# Patient Record
Sex: Male | Born: 1970 | Race: White | Hispanic: No | Marital: Married | Smoking: Former smoker
Health system: Southern US, Community
[De-identification: ages and names within clinical notes are randomized; demographics above are authoritative.]

## PROBLEM LIST (undated history)

## (undated) DIAGNOSIS — G43909 Migraine, unspecified, not intractable, without status migrainosus: Secondary | ICD-10-CM

## (undated) DIAGNOSIS — M545 Low back pain, unspecified: Secondary | ICD-10-CM

## (undated) DIAGNOSIS — F419 Anxiety disorder, unspecified: Secondary | ICD-10-CM

## (undated) DIAGNOSIS — G43109 Migraine with aura, not intractable, without status migrainosus: Secondary | ICD-10-CM

## (undated) DIAGNOSIS — M1990 Unspecified osteoarthritis, unspecified site: Secondary | ICD-10-CM

## (undated) DIAGNOSIS — N200 Calculus of kidney: Secondary | ICD-10-CM

## (undated) DIAGNOSIS — T7840XA Allergy, unspecified, initial encounter: Secondary | ICD-10-CM

## (undated) DIAGNOSIS — E739 Lactose intolerance, unspecified: Secondary | ICD-10-CM

## (undated) DIAGNOSIS — I2510 Atherosclerotic heart disease of native coronary artery without angina pectoris: Secondary | ICD-10-CM

## (undated) DIAGNOSIS — E785 Hyperlipidemia, unspecified: Secondary | ICD-10-CM

## (undated) HISTORY — DX: Migraine with aura, not intractable, without status migrainosus: G43.109

## (undated) HISTORY — DX: Allergy, unspecified, initial encounter: T78.40XA

## (undated) HISTORY — PX: WISDOM TOOTH EXTRACTION: SHX21

## (undated) HISTORY — DX: Calculus of kidney: N20.0

## (undated) HISTORY — DX: Unspecified osteoarthritis, unspecified site: M19.90

## (undated) HISTORY — PX: SHOULDER SURGERY: SHX246

## (undated) HISTORY — DX: Low back pain, unspecified: M54.50

## (undated) HISTORY — PX: OTHER SURGICAL HISTORY: SHX169

## (undated) HISTORY — DX: Atherosclerotic heart disease of native coronary artery without angina pectoris: I25.10

## (undated) HISTORY — DX: Hyperlipidemia, unspecified: E78.5

## (undated) HISTORY — DX: Gilbert syndrome: E80.4

## (undated) HISTORY — DX: Migraine, unspecified, not intractable, without status migrainosus: G43.909

## (undated) HISTORY — DX: Lactose intolerance, unspecified: E73.9

## (undated) HISTORY — DX: Anxiety disorder, unspecified: F41.9

---

## 2002-08-14 ENCOUNTER — Ambulatory Visit: Admission: RE | Admit: 2002-08-14 | Discharge: 2002-08-14 | Payer: Self-pay | Admitting: Gastroenterology

## 2003-01-10 ENCOUNTER — Emergency Department (HOSPITAL_COMMUNITY): Admission: EM | Admit: 2003-01-10 | Discharge: 2003-01-10 | Payer: Self-pay | Admitting: Emergency Medicine

## 2003-01-10 ENCOUNTER — Encounter: Payer: Self-pay | Admitting: Emergency Medicine

## 2003-09-22 HISTORY — PX: FLEXIBLE SIGMOIDOSCOPY: SHX1649

## 2005-05-27 ENCOUNTER — Ambulatory Visit: Payer: Self-pay | Admitting: Internal Medicine

## 2007-12-21 ENCOUNTER — Ambulatory Visit: Payer: Self-pay | Admitting: Internal Medicine

## 2007-12-21 DIAGNOSIS — K602 Anal fissure, unspecified: Secondary | ICD-10-CM | POA: Insufficient documentation

## 2007-12-21 DIAGNOSIS — E739 Lactose intolerance, unspecified: Secondary | ICD-10-CM | POA: Insufficient documentation

## 2007-12-21 DIAGNOSIS — Z87442 Personal history of urinary calculi: Secondary | ICD-10-CM | POA: Insufficient documentation

## 2007-12-21 DIAGNOSIS — Z9189 Other specified personal risk factors, not elsewhere classified: Secondary | ICD-10-CM | POA: Insufficient documentation

## 2007-12-21 DIAGNOSIS — K625 Hemorrhage of anus and rectum: Secondary | ICD-10-CM | POA: Insufficient documentation

## 2007-12-21 LAB — CONVERTED CEMR LAB
Basophils Absolute: 0 10*3/uL (ref 0.0–0.1)
Basophils Relative: 0.4 % (ref 0.0–1.0)
Eosinophils Absolute: 0 10*3/uL (ref 0.0–0.7)
Eosinophils Relative: 0.6 % (ref 0.0–5.0)
HCT: 45.3 % (ref 39.0–52.0)
MCHC: 34.2 g/dL (ref 30.0–36.0)
MCV: 92 fL (ref 78.0–100.0)
Monocytes Absolute: 0.5 10*3/uL (ref 0.1–1.0)
Neutrophils Relative %: 56.9 % (ref 43.0–77.0)
Platelets: 186 10*3/uL (ref 150–400)

## 2007-12-22 ENCOUNTER — Encounter (INDEPENDENT_AMBULATORY_CARE_PROVIDER_SITE_OTHER): Payer: Self-pay | Admitting: *Deleted

## 2007-12-26 ENCOUNTER — Encounter (INDEPENDENT_AMBULATORY_CARE_PROVIDER_SITE_OTHER): Payer: Self-pay | Admitting: *Deleted

## 2008-01-10 ENCOUNTER — Ambulatory Visit: Payer: Self-pay | Admitting: Gastroenterology

## 2008-01-17 ENCOUNTER — Ambulatory Visit: Payer: Self-pay | Admitting: Internal Medicine

## 2008-01-17 DIAGNOSIS — L259 Unspecified contact dermatitis, unspecified cause: Secondary | ICD-10-CM | POA: Insufficient documentation

## 2008-01-18 ENCOUNTER — Encounter: Payer: Self-pay | Admitting: Internal Medicine

## 2008-01-24 ENCOUNTER — Telehealth: Payer: Self-pay | Admitting: Internal Medicine

## 2008-01-27 ENCOUNTER — Ambulatory Visit: Payer: Self-pay | Admitting: Internal Medicine

## 2008-01-27 ENCOUNTER — Telehealth (INDEPENDENT_AMBULATORY_CARE_PROVIDER_SITE_OTHER): Payer: Self-pay | Admitting: *Deleted

## 2008-02-14 ENCOUNTER — Ambulatory Visit: Payer: Self-pay | Admitting: Internal Medicine

## 2008-02-14 DIAGNOSIS — I776 Arteritis, unspecified: Secondary | ICD-10-CM | POA: Insufficient documentation

## 2008-03-07 ENCOUNTER — Telehealth (INDEPENDENT_AMBULATORY_CARE_PROVIDER_SITE_OTHER): Payer: Self-pay | Admitting: *Deleted

## 2008-07-04 ENCOUNTER — Ambulatory Visit: Payer: Self-pay | Admitting: Internal Medicine

## 2008-07-31 ENCOUNTER — Ambulatory Visit: Payer: Self-pay | Admitting: Internal Medicine

## 2008-07-31 DIAGNOSIS — M940 Chondrocostal junction syndrome [Tietze]: Secondary | ICD-10-CM | POA: Insufficient documentation

## 2008-11-22 ENCOUNTER — Ambulatory Visit: Payer: Self-pay | Admitting: Internal Medicine

## 2008-12-06 ENCOUNTER — Encounter (INDEPENDENT_AMBULATORY_CARE_PROVIDER_SITE_OTHER): Payer: Self-pay | Admitting: *Deleted

## 2009-05-21 ENCOUNTER — Ambulatory Visit: Payer: Self-pay | Admitting: Internal Medicine

## 2009-05-28 ENCOUNTER — Encounter (INDEPENDENT_AMBULATORY_CARE_PROVIDER_SITE_OTHER): Payer: Self-pay | Admitting: *Deleted

## 2009-08-08 ENCOUNTER — Ambulatory Visit: Payer: Self-pay | Admitting: Internal Medicine

## 2009-08-08 DIAGNOSIS — E785 Hyperlipidemia, unspecified: Secondary | ICD-10-CM | POA: Insufficient documentation

## 2009-08-08 LAB — CONVERTED CEMR LAB: Cholesterol, target level: 200 mg/dL

## 2010-07-21 ENCOUNTER — Ambulatory Visit: Payer: Self-pay | Admitting: Family Medicine

## 2010-07-21 DIAGNOSIS — J019 Acute sinusitis, unspecified: Secondary | ICD-10-CM | POA: Insufficient documentation

## 2010-07-21 DIAGNOSIS — L049 Acute lymphadenitis, unspecified: Secondary | ICD-10-CM | POA: Insufficient documentation

## 2010-10-19 LAB — CONVERTED CEMR LAB
ALT: 13 units/L (ref 0–53)
AST: 16 units/L (ref 0–37)
Albumin: 4.5 g/dL (ref 3.5–5.2)
Alkaline Phosphatase: 41 units/L (ref 39–117)
BUN: 18 mg/dL (ref 6–23)
Basophils Absolute: 0 10*3/uL (ref 0.0–0.1)
Basophils Relative: 0 % (ref 0.0–3.0)
Bilirubin, Direct: 0.1 mg/dL (ref 0.0–0.3)
CO2: 30 meq/L (ref 19–32)
Calcium: 9.6 mg/dL (ref 8.4–10.5)
Chloride: 104 meq/L (ref 96–112)
Cholesterol: 183 mg/dL (ref 0–200)
Creatinine, Ser: 0.9 mg/dL (ref 0.4–1.5)
Eosinophils Absolute: 0 10*3/uL (ref 0.0–0.7)
Eosinophils Relative: 0.5 % (ref 0.0–5.0)
GFR calc Af Amer: 121 mL/min
GFR calc non Af Amer: 100 mL/min
Glucose, Bld: 109 mg/dL — ABNORMAL HIGH (ref 70–99)
HCT: 46.5 % (ref 39.0–52.0)
HDL: 46.6 mg/dL (ref >39.0)
Hemoglobin: 16.3 g/dL (ref 13.0–17.0)
Hgb A1c MFr Bld: 5.2 % (ref 4.6–6.5)
LDL Cholesterol: 122 mg/dL — ABNORMAL HIGH (ref 0–99)
Lymphocytes Relative: 38 % (ref 12.0–46.0)
MCHC: 35 g/dL (ref 30.0–36.0)
MCV: 94.4 fL (ref 78.0–100.0)
Monocytes Absolute: 0.4 10*3/uL (ref 0.1–1.0)
Monocytes Relative: 7.3 % (ref 3.0–12.0)
Neutro Abs: 3.1 10*3/uL (ref 1.4–7.7)
Neutrophils Relative %: 54.2 % (ref 43.0–77.0)
PSA: 0.39 ng/mL (ref 0.10–4.00)
Platelets: 191 10*3/uL (ref 150–400)
Potassium: 4.3 meq/L (ref 3.5–5.1)
RBC: 4.93 M/uL (ref 4.22–5.81)
RDW: 12.4 % (ref 11.5–14.6)
Sodium: 141 meq/L (ref 135–145)
TSH: 2.66 microintl units/mL (ref 0.35–5.50)
Total Bilirubin: 1.3 mg/dL — ABNORMAL HIGH (ref 0.3–1.2)
Total CHOL/HDL Ratio: 3.9
Total Protein: 7.1 g/dL (ref 6.0–8.3)
Triglycerides: 72 mg/dL (ref 0–149)
VLDL: 14 mg/dL (ref 0–40)
WBC: 5.7 10*3/uL (ref 4.5–10.5)

## 2010-10-21 NOTE — Assessment & Plan Note (Signed)
Summary: SINUS INF?, NO FEVER///SPH   Vital Signs:  Patient profile:   40 year old male Height:      69.50 inches Weight:      176 pounds BMI:     25.71 Temp:     98.1 degrees F oral BP sitting:   120 / 78  (left arm)  Vitals Entered By: Doristine Devoid CMA (July 21, 2010 1:36 PM) CC: sinus infection x1 wk along w/ HA and drainage    History of Present Illness: 40 yo man here today for ? sinus infxn.  sxs started as URI 5 days ago.  progressively worsening.  multiple sick contacts at home.  small painful LN behind L ear.  no fever.  + facial pain behind eyes.  + nasal congestion- 'greenish brown'.  cough intermittantly productive of similar sputum.  no ear pain.  + laryngitis.  has baby due by the end of this week.  Current Medications (verified): 1)  Fluoxetine Hcl 10 Mg Tabs (Fluoxetine Hcl) .... Take One Tablet Daily  Allergies (verified): No Known Drug Allergies  Review of Systems      See HPI  Physical Exam  General:  well-nourished,in no acute distress; alert,appropriate and cooperative throughout examination Head:  NCAT, mild TTP over maxillary sinuses Eyes:  no injxn or inflammation Ears:  External ear exam shows no significant lesions or deformities.  Otoscopic examination reveals clear canals, tympanic membranes are intact bilaterally without bulging, retraction, inflammation or discharge. Hearing is grossly normal bilaterally. Nose:  mild congestion Mouth:  Oral mucosa and oropharynx without lesions or exudates.  Teeth in good repair. Neck:  post LAD, mildly TTP Lungs:  Normal respiratory effort, chest expands symmetrically. Lungs are clear to auscultation, no crackles or wheezes. Heart:  Normal rate and regular rhythm. S1 and S2 normal without gallop, murmur, click, rub. S4   Impression & Recommendations:  Problem # 1:  SINUSITIS - ACUTE-NOS (ICD-461.9) Assessment New sxs may be viral but given progression of sxs and impending arrival of new baby will start  abx.  reviewed supportive care and red flags that should prompt return.  Pt expresses understanding and is in agreement w/ this plan. His updated medication list for this problem includes:    Doxycycline Hyclate 100 Mg Caps (Doxycycline hyclate) .Marland Kitchen... Take 1 tab twice a day.  take w/ food.  Problem # 2:  ACUTE LYMPHADENITIS (ICD-683) Assessment: New will start doxy His updated medication list for this problem includes:    Doxycycline Hyclate 100 Mg Caps (Doxycycline hyclate) .Marland Kitchen... Take 1 tab twice a day.  take w/ food.  Complete Medication List: 1)  Fluoxetine Hcl 10 Mg Tabs (Fluoxetine hcl) .... Take one tablet daily 2)  Doxycycline Hyclate 100 Mg Caps (Doxycycline hyclate) .... Take 1 tab twice a day.  take w/ food.  Patient Instructions: 1)  Take the Doxycycline as directed for the sinuses and the lymp node- take w/ food to avoid upset stomach 2)  Drink LOTS of fluids 3)  Ibuprofen for pain/fever- this will help w/ laryngitis also 4)  REST!!! 5)  Good luck with the new baby!!! 6)  Hang in there!! Prescriptions: DOXYCYCLINE HYCLATE 100 MG CAPS (DOXYCYCLINE HYCLATE) Take 1 tab twice a day.  take w/ food.  #20 x 0   Entered and Authorized by:   Neena Rhymes MD   Signed by:   Neena Rhymes MD on 07/21/2010   Method used:   Electronically to        [facility]  [address] [phone]* (retail)       [address]       [place], [place]  [address]       Ph: [phone] or [phone]       Fax: [phone]   RxID:   [id number]    Orders Added: 1)  Est. Patient Level III [phone]

## 2011-02-03 NOTE — Assessment & Plan Note (Signed)
[facility]                         GASTROENTEROLOGY OFFICE NOTE   NAME:[name], [name] GOZA                    MRN:          [id number]  DATE:01/10/2008                            DOB:          [birth year]    REASON FOR CONSULTATION:  Rectal pain.   HISTORY OF PRESENT ILLNESS:  [name] [name] is a pleasant 40 year old white  male referred through the courtesy of Dr. Alwyn [name] for evaluation.  For  the last five months, he has been suffering from severe rectal pain,  consisting of pain with defecation.  With a bowel movement, he has a  tearing, searing pain in his rectum followed by small amounts of blood  on the toilet tissue or coating the stools.  He moves his bowels  regularly.  He denies abdominal pain.  He underwent a sigmoidoscopy in  2003 for rectal bleeding and was found to have internal hemorrhoids.  He  has been treated with various suppositories and topical without much  improvement.   PAST MEDICAL HISTORY:  Pertinent for kidney stones.  He has had shoulder  surgery .   FAMILY HISTORY:  Pertinent for father and grandfather with prostate  cancer.   MEDICATIONS:  None.   ALLERGIES:  None.   SOCIAL HISTORY:  He does not smoke.  He has 2-4 beers a week.  He is  married and is self-employed.   REVIEW OF SYSTEMS:  Reviewed and is negative.   PHYSICAL EXAMINATION:  GENERAL:  A healthy appearing male.  VITAL SIGNS:  Pulse 80, blood pressure 124/84, weight 170.  HEENT:  EOMI.  PERRLA.  Sclerae are anicteric.  Conjunctivae are pink.  NECK:  Supple without thyromegaly, adenopathy or carotid bruits.  CHEST:  Clear to auscultation and percussion without adventitious  sounds.  CARDIAC:  Regular rhythm; normal S1 S2.  There are no murmurs, gallops  or rubs.  ABDOMEN:  Bowel sounds are normoactive.  Abdomen is soft, nontender and  nondistended.  There are no abdominal masses, tenderness, splenic  enlargement or hepatomegaly.  EXTREMITIES:  Full range  of motion.  No cyanosis, clubbing or edema.  RECTAL:  There are no obvious external lesions.  MUSCULOSKELETAL:  No deformities.  NEUROLOGICAL:  He is alert and oriented x3 and is nonfocal.   IMPRESSION:  Symptomatic anal fissure.  I am certain that if I examined  him with an anoscope I would find a fissure in the anterior midline.   RECOMMENDATION:  1. Diltiazem ointment 2% b.i.d.  2. Stool softeners.  3. Warm soaks.  4. If these measures are ineffective, I would proceed with Botox      injection of his anal fissure.  Family met, he would require a      sphincterotomy.     Barbette Hair. Arlyce Dice, MD,FACG  Electronically Signed    RDK/MedQ  DD: 01/10/2008  DT: 01/10/2008  Job #: [id number]   cc:   Titus Dubin. [name] Ren, MD,FACP,FCCP

## 2011-05-20 ENCOUNTER — Encounter: Payer: Self-pay | Admitting: Internal Medicine

## 2011-06-29 ENCOUNTER — Encounter: Payer: Self-pay | Admitting: Internal Medicine

## 2011-06-29 ENCOUNTER — Ambulatory Visit (INDEPENDENT_AMBULATORY_CARE_PROVIDER_SITE_OTHER): Payer: BC Managed Care – PPO | Admitting: Internal Medicine

## 2011-06-29 VITALS — BP 120/84 | HR 75 | Temp 98.4°F | Resp 12 | Ht 68.0 in | Wt 171.6 lb

## 2011-06-29 DIAGNOSIS — F419 Anxiety disorder, unspecified: Secondary | ICD-10-CM

## 2011-06-29 DIAGNOSIS — E785 Hyperlipidemia, unspecified: Secondary | ICD-10-CM

## 2011-06-29 DIAGNOSIS — Z0000 Encounter for general adult medical examination without abnormal findings: Secondary | ICD-10-CM

## 2011-06-29 DIAGNOSIS — F411 Generalized anxiety disorder: Secondary | ICD-10-CM

## 2011-06-29 DIAGNOSIS — Z23 Encounter for immunization: Secondary | ICD-10-CM

## 2011-06-29 DIAGNOSIS — E739 Lactose intolerance, unspecified: Secondary | ICD-10-CM

## 2011-06-29 MED ORDER — LORAZEPAM 0.5 MG PO TABS: 0.5000 mg | ORAL_TABLET | ORAL | Status: AC

## 2011-06-29 NOTE — Progress Notes (Signed)
Subjective:    Patient ID: [name] [name], male    DOB: Dec 19, 1970, 40 y.o.   MRN: [id number]  HPI  [name] [name]  is here for a physical;acute issues include resolving RTI .       Review of Systems Patient reports no significant   vision/ hearing changes,anorexia, weight change, fever ,adenopathy, persistant / recurrent hoarseness, swallowing issues, chest pain,palpitations, edema,persistant / recurrent cough, hemoptysis, dyspnea(rest, exertional, paroxysmal nocturnal), gastrointestinal  bleeding (melena, , abdominal pain, excessive heart burn, GU symptoms( dysuria, hematuria, pyuria, voiding/incontinence  issues) syncope, focal weakness, memory loss,numbness & tingling, skin/hair/nail changes,depression, anxiety,or  abnormal bruising/bleeding. Musculoskeletal symptoms include intermittent  R knee pain post squatting or post exercise. He has experienced intermittent rectal bleeding attributed to hemorrhoids found at flexible sigmoidoscopy. There is no family history of colon cancer     Objective:   Physical Exam Gen.: Healthy and well-nourished in appearance. Alert, appropriate and cooperative throughout exam. Head: Normocephalic without obvious abnormalities  Eyes: No corneal or conjunctival inflammation noted. Pupils equal round reactive to light and accommodation. Fundal exam is benign without hemorrhages, exudate, papilledema. Extraocular motion intact. Vision grossly normal. Ears: External  ear exam reveals no significant lesions or deformities. Canals clear .TMs normal. Hearing is grossly normal bilaterally. Nose: External nasal exam reveals no deformity or inflammation. Nasal mucosa are pink and moist. No lesions or exudates noted.   Mouth: Oral mucosa and oropharynx reveal no lesions or exudates. Teeth in good repair. Neck: No deformities, masses, or tenderness noted. Range of motion  & Thyroid normal. Lungs: Normal respiratory effort; chest expands symmetrically. Lungs are clear to  auscultation without rales, wheezes, or increased work of breathing. Heart: Normal rate and rhythm. Normal S1 and S2. No gallop,  or rub. Loud S4 vs click @ apex; no  murmur. Abdomen: Bowel sounds normal; abdomen soft and nontender. No masses, organomegaly or hernias noted. Genitalia/DRE: Varicoele on the left; prostate is normal without enlargement, nodularity, or induration.   .                                                                                   Musculoskeletal/extremities: No deformity or scoliosis noted of  the thoracic or lumbar spine. No clubbing, cyanosis, edema, or deformity noted. Range of motion  normal .Tone & strength  normal.Joints normal. Nail health  good. Vascular: Carotid, radial artery, dorsalis pedis and  posterior tibial pulses are full and equal. No bruits present. Neurologic: Alert and oriented x3. Deep tendon reflexes symmetrical and normal.          Skin: Intact without suspicious lesions or rashes. Lymph: No cervical, axillary, or inguinal lymphadenopathy present. Psych: Mood and affect are normal. Normally interactive  Assessment & Plan:  #1 comprehensive physical exam; no acute findings #2 see Problem List with Assessments & Recommendations Plan: see Orders

## 2011-06-29 NOTE — Patient Instructions (Signed)
Preventive Health Care: Exercise at least 30-45 minutes a day,  3-4 days a week.  Eat a low-fat diet with lots of fruits and vegetables, up to 7-9 servings per day. Consume less than 40 grams of sugar per day from foods & drinks with High Fructose Corn Sugar as # 1,2,3 or # 4 on label. Alcohol If you drink, do it moderately,less than 9 drinks per week, preferably less than 6 @ most. Health Care Power of Attorney & Living Will. Complete if not in place ; these place you in charge of your health care decisions.  

## 2011-06-30 LAB — CBC WITH DIFFERENTIAL/PLATELET
Basophils Relative: 0.1 % (ref 0.0–3.0)
Eosinophils Absolute: 0.1 10*3/uL (ref 0.0–0.7)
Eosinophils Relative: 1.1 % (ref 0.0–5.0)
Lymphocytes Relative: 37.4 % (ref 12.0–46.0)
MCHC: 34 g/dL (ref 30.0–36.0)
Monocytes Absolute: 0.4 10*3/uL (ref 0.1–1.0)
Neutrophils Relative %: 55 % (ref 43.0–77.0)
Platelets: 197 10*3/uL (ref 150.0–400.0)
RBC: 5.01 Mil/uL (ref 4.22–5.81)
WBC: 7 10*3/uL (ref 4.5–10.5)

## 2011-06-30 LAB — HEPATIC FUNCTION PANEL
ALT: 17 U/L (ref 0–53)
AST: 20 U/L (ref 0–37)
Albumin: 4.8 g/dL (ref 3.5–5.2)
Alkaline Phosphatase: 60 U/L (ref 39–117)
Total Protein: 7.7 g/dL (ref 6.0–8.3)

## 2011-06-30 LAB — LIPID PANEL
Cholesterol: 230 mg/dL — ABNORMAL HIGH (ref 0–200)
HDL: 48.8 mg/dL (ref >39.00)
Triglycerides: 175 mg/dL — ABNORMAL HIGH (ref 0.0–149.0)

## 2011-06-30 LAB — LDL CHOLESTEROL, DIRECT: Direct LDL: 148.6 mg/dL

## 2011-06-30 LAB — BASIC METABOLIC PANEL
BUN: 14 mg/dL (ref 6–23)
Calcium: 9.5 mg/dL (ref 8.4–10.5)
Creatinine, Ser: 1 mg/dL (ref 0.4–1.5)

## 2011-06-30 LAB — PSA: PSA: 0.51 ng/mL (ref 0.10–4.00)

## 2011-06-30 LAB — TSH: TSH: 2.67 u[IU]/mL (ref 0.35–5.50)

## 2011-07-08 ENCOUNTER — Other Ambulatory Visit: Payer: Self-pay | Admitting: Internal Medicine

## 2011-07-08 NOTE — Telephone Encounter (Signed)
Patient received copy of lab dr hopper said to consider pravastatin & repeat lab in 10 weeks - patient would like rx called [facility]

## 2011-07-09 ENCOUNTER — Telehealth: Payer: Self-pay | Admitting: Internal Medicine

## 2011-07-10 MED ORDER — PRAVASTATIN SODIUM 20 MG PO TABS: 20.0000 mg | ORAL_TABLET | Freq: Every day | ORAL | Status: DC

## 2011-07-10 NOTE — Telephone Encounter (Signed)
RX sent to pharmacy, patient aware. 

## 2011-10-31 ENCOUNTER — Other Ambulatory Visit: Payer: Self-pay | Admitting: Internal Medicine

## 2011-11-02 NOTE — Telephone Encounter (Signed)
(  lipids, hepatic panel, CK; 272.4, 995.20).   

## 2012-03-04 ENCOUNTER — Telehealth: Payer: Self-pay | Admitting: Internal Medicine

## 2012-03-04 DIAGNOSIS — E785 Hyperlipidemia, unspecified: Secondary | ICD-10-CM

## 2012-03-04 DIAGNOSIS — T887XXA Unspecified adverse effect of drug or medicament, initial encounter: Secondary | ICD-10-CM

## 2012-03-04 NOTE — Telephone Encounter (Signed)
Correct

## 2012-03-04 NOTE — Telephone Encounter (Signed)
Patient was due labs 10/wks from 06/2011 appt.., patient wants to come in 6.27 to have them done now. Based on last labs the following orders need to be out in   repeat fasting labs in 10 weeks (lipids, hepatic panel, CK; 272.4, 995.20).

## 2012-03-08 NOTE — Telephone Encounter (Signed)
Chrae- can you put the orders in for this patient please? Thank you!

## 2012-03-08 NOTE — Telephone Encounter (Signed)
Orders placed.

## 2012-03-17 ENCOUNTER — Other Ambulatory Visit (INDEPENDENT_AMBULATORY_CARE_PROVIDER_SITE_OTHER): Payer: BC Managed Care – PPO

## 2012-03-17 DIAGNOSIS — E785 Hyperlipidemia, unspecified: Secondary | ICD-10-CM

## 2012-03-17 DIAGNOSIS — T887XXA Unspecified adverse effect of drug or medicament, initial encounter: Secondary | ICD-10-CM

## 2012-03-17 LAB — HEPATIC FUNCTION PANEL
ALT: 17 U/L (ref 0–53)
Bilirubin, Direct: 0.2 mg/dL (ref 0.0–0.3)
Total Protein: 7.6 g/dL (ref 6.0–8.3)

## 2012-03-17 LAB — LIPID PANEL
Cholesterol: 174 mg/dL (ref 0–200)
VLDL: 36.2 mg/dL (ref 0.0–40.0)

## 2012-03-17 NOTE — Progress Notes (Signed)
Labs only

## 2012-04-06 NOTE — Telephone Encounter (Signed)
error 

## 2012-06-30 ENCOUNTER — Ambulatory Visit (INDEPENDENT_AMBULATORY_CARE_PROVIDER_SITE_OTHER): Payer: BC Managed Care – PPO | Admitting: Internal Medicine

## 2012-06-30 ENCOUNTER — Encounter: Payer: Self-pay | Admitting: Internal Medicine

## 2012-06-30 VITALS — BP 126/84 | HR 80 | Temp 98.4°F | Resp 12 | Ht 68.03 in | Wt 172.6 lb

## 2012-06-30 DIAGNOSIS — E785 Hyperlipidemia, unspecified: Secondary | ICD-10-CM

## 2012-06-30 DIAGNOSIS — Z0000 Encounter for general adult medical examination without abnormal findings: Secondary | ICD-10-CM

## 2012-06-30 DIAGNOSIS — Z23 Encounter for immunization: Secondary | ICD-10-CM

## 2012-06-30 LAB — BASIC METABOLIC PANEL
BUN: 16 mg/dL (ref 6–23)
CO2: 28 mEq/L (ref 19–32)
Calcium: 9.3 mg/dL (ref 8.4–10.5)
Glucose, Bld: 101 mg/dL — ABNORMAL HIGH (ref 70–99)
Potassium: 3.9 mEq/L (ref 3.5–5.1)
Sodium: 137 mEq/L (ref 135–145)

## 2012-06-30 LAB — LIPID PANEL
Cholesterol: 197 mg/dL (ref 0–200)
HDL: 45.6 mg/dL (ref >39.00)
Triglycerides: 149 mg/dL (ref 0.0–149.0)
VLDL: 29.8 mg/dL (ref 0.0–40.0)

## 2012-06-30 LAB — CBC WITH DIFFERENTIAL/PLATELET
Basophils Absolute: 0 10*3/uL (ref 0.0–0.1)
Eosinophils Absolute: 0.1 10*3/uL (ref 0.0–0.7)
HCT: 47.4 % (ref 39.0–52.0)
Hemoglobin: 15.9 g/dL (ref 13.0–17.0)
Lymphs Abs: 1.2 10*3/uL (ref 0.7–4.0)
MCHC: 33.6 g/dL (ref 30.0–36.0)
Neutro Abs: 3.6 10*3/uL (ref 1.4–7.7)
Platelets: 162 10*3/uL (ref 150.0–400.0)
RDW: 13.1 % (ref 11.5–14.6)

## 2012-06-30 LAB — HEPATIC FUNCTION PANEL: Albumin: 4.6 g/dL (ref 3.5–5.2)

## 2012-06-30 MED ORDER — FLUOXETINE HCL 10 MG PO CAPS: 10.0000 mg | ORAL_CAPSULE | Freq: Every day | ORAL | Status: DC

## 2012-06-30 NOTE — Progress Notes (Signed)
Subjective:    Patient ID: [name] [name], male    DOB: 12-28-1970, 41 y.o.   MRN: [id number]  HPI  [name] [name] is here for a physical;acute issues are mainly related to job stresses      Review of Systems Dyslipidemia assessment: Prior Advanced Lipid Testing: NMR Lipoprofile 2010; LDL goal less than 110   Family history of premature CAD/ MI: no; PGF MI @ 53 .  Nutrition: no specific plan .  Exercise: 3-5X/week as cardio& weight > 60 min . Diabetes : no . HTN: no. Smoking history  : quit 1994 .   Weight :  stable. ROS: fatigue: some due to disrupted sleep related to new job ; chest pain : no ;claudication: no; palpitations: no; abd pain/bowel changes: no ; myalgias:no;  syncope : no ; skin changes: no.  He discontinued and the statin in February due to 2 foot and ankle pain. Followup lipids in June revealed an LDL of 94 OFF statins. Aspirin intake has been somewhat erratic        Objective:   Physical Exam Gen.: Healthy and well-nourished in appearance. Alert, appropriate and cooperative throughout exam. Head: Normocephalic without obvious abnormalities  Eyes: No corneal or conjunctival inflammation noted. Pupils equal round reactive to light and accommodation. Fundal exam is benign without hemorrhages, exudate, papilledema. Extraocular motion intact. Vision grossly normal. Ears: External  ear exam reveals no significant lesions or deformities. Canals : wax on R; L TM normal. Hearing is grossly normal bilaterally. Nose: External nasal exam reveals no deformity or inflammation. Nasal mucosa are pink and moist. No lesions or exudates noted.   Mouth: Oral mucosa and oropharynx reveal no lesions or exudates. Teeth in good repair. Neck: No deformities, masses, or tenderness noted. Range of motion & Thyroid normal. Lungs: Normal respiratory effort; chest expands symmetrically. Lungs are clear to auscultation without rales, wheezes, or increased work of breathing. Heart: Normal rate and  rhythm. Normal S1 and S2. No gallop, click, or rub. S4 w/o murmur. Abdomen: Bowel sounds normal; abdomen soft and nontender. No masses, organomegaly or hernias noted. Genitalia/ DRE: Genitalia normal except for left varices. Prostate is normal without enlargement, asymmetry, nodularity, or induration.                                                              Musculoskeletal/extremities: No deformity or scoliosis noted of  the thoracic or lumbar spine. No clubbing, cyanosis, edema, or deformity noted. Range of motion  normal .Tone & strength  normal.Joints normal. Nail health  good. Vascular: Carotid, radial artery, dorsalis pedis and  posterior tibial pulses are full and equal. No bruits present. Neurologic: Alert and oriented x3. Deep tendon reflexes symmetrical and normal.         Skin: Intact without suspicious lesions or rashes. Lymph: No cervical, axillary, or inguinal lymphadenopathy present. Psych: Mood and affect are normal. Normally interactive  Assessment & Plan:  Annual podiatry and retinal assessments are indicated because the diabetes.

## 2012-06-30 NOTE — Patient Instructions (Addendum)
Preventive Health Care: Eat a low-fat diet with lots of fruits and vegetables, up to 7-9 servings per day.  Consume less than 40 grams of sugar per day from foods & drinks with High Fructose Corn Sugar as #2,3 or # 4 on label. Please take enteric-coated aspirin 81 mg daily with breakfast.  Health Care Power of Attorney & Living Will. Complete if not in place ; these place you in charge of your health care decisions. If you activate My Chart; the results can be released to you as soon as they populate from the lab. If you choose not to use this program; the labs have to be reviewed, copied & mailed   causing a delay in getting the results to you.

## 2012-12-29 ENCOUNTER — Encounter: Payer: Self-pay | Admitting: Internal Medicine

## 2012-12-29 ENCOUNTER — Ambulatory Visit (INDEPENDENT_AMBULATORY_CARE_PROVIDER_SITE_OTHER): Payer: 59 | Admitting: Internal Medicine

## 2012-12-29 VITALS — BP 134/80 | HR 71 | Temp 97.9°F | Wt 172.0 lb

## 2012-12-29 DIAGNOSIS — E785 Hyperlipidemia, unspecified: Secondary | ICD-10-CM

## 2012-12-29 DIAGNOSIS — G43109 Migraine with aura, not intractable, without status migrainosus: Secondary | ICD-10-CM | POA: Insufficient documentation

## 2012-12-29 DIAGNOSIS — G453 Amaurosis fugax: Secondary | ICD-10-CM

## 2012-12-29 DIAGNOSIS — H3400 Transient retinal artery occlusion, unspecified eye: Secondary | ICD-10-CM

## 2012-12-29 HISTORY — DX: Migraine with aura, not intractable, without status migrainosus: G43.109

## 2012-12-29 NOTE — Assessment & Plan Note (Signed)
Carotid Dopplers, echocardiogram, fasting lipids.  Baby aspirin will be increased to 325 mg aspirin pending full ophthalmologic evaluation

## 2012-12-29 NOTE — Progress Notes (Signed)
  Subjective:    Patient ID: [name] [name], male    DOB: [birth year], 42 y.o.   MRN: [id number]  HPI  Since early March  he's had 2 episodes of vision loss in the left eye lasting 12-15 minutes w/o diplopia. It is described as a "arc" in the shape of a reversed C.Th last episode was 12/28/12.Last Ophth exam 18 mos ago. No headache, numbness and tingling,limb  weakness, change in coordination (gait/falling), frank vertigo, or pre syncope.  There was no specific exacerbating factor such as  position change such as turning head ;   straining ,pain, cardiac or neurologic prodrome.  He has dyslipidemia; his pravastatin had been discontinued due to arthralgias.       Review of Systems Constitutional : No fever , chills, sweats , change in weight ENT:No sinus pressure;nasal purulence; hearing loss/tinnitus;earache ;otic discharge Cardiac prodrome: No palpitations, irregular rhythm, heart rate change, nausea , sweating Pulmonary: No acute dyspnea, cough Psych: No significant anxiety,panic , depression     Objective:   Physical Exam Gen. appearance: Well-nourished, in no distress Eyes: Extraocular motion intact, field of vision normal, vision grossly intact, no nystagmus. Fundi benign ENT: Wax on R; L TM normal, tuning fork exam normal, hearing grossly normal Neck: Normal range of motion, no masses, normal thyroid Cardiovascular: Rate and rhythm normal; no murmurs, or gallops . Click @ LLSB Muscle skeletal: Range of motion, tone, &  strength normal Neuro:Oriented X3.No cranial nerve deficit, deep tendon  reflexes normal, gait  normal. Rhomberg & finger to nose negative. Lymph: No cervical or axillary LA Skin: Warm and dry without suspicious lesions or rashes Psych: no anxiety or mood change. Normally interactive and cooperative.         Assessment & Plan:

## 2012-12-29 NOTE — Patient Instructions (Addendum)
Please  schedule fasting Labs : CK,Lipids, CRP, sed rate.PLEASE BRING THESE INSTRUCTIONS TO FOLLOW UP  LAB APPOINTMENT.This will guarantee correct labs are drawn, eliminating need for repeat blood sampling ( needle sticks ! ). Diagnoses /Codes: 362.34,272.4 Review and correct the record as indicated. Please share record with all medical staff seen.

## 2012-12-29 NOTE — Assessment & Plan Note (Signed)
He's been off statin due to ankle and foot arthralgias. He takes a supplement red Rice Yeast

## 2012-12-30 ENCOUNTER — Ambulatory Visit: Payer: BC Managed Care – PPO | Admitting: Internal Medicine

## 2012-12-30 NOTE — Addendum Note (Signed)
Addended by: Maurice Small on: 12/30/2012 09:12 AM   Modules accepted: Orders

## 2013-01-02 ENCOUNTER — Telehealth: Payer: Self-pay

## 2013-01-02 ENCOUNTER — Other Ambulatory Visit (INDEPENDENT_AMBULATORY_CARE_PROVIDER_SITE_OTHER): Payer: 59

## 2013-01-02 DIAGNOSIS — E785 Hyperlipidemia, unspecified: Secondary | ICD-10-CM

## 2013-01-02 LAB — C-REACTIVE PROTEIN: CRP: <0.5 mg/dL (ref 0.5–20.0)

## 2013-01-02 LAB — LIPID PANEL
Cholesterol: 163 mg/dL (ref 0–200)
LDL Cholesterol: 102 mg/dL — ABNORMAL HIGH (ref 0–99)
Total CHOL/HDL Ratio: 4
VLDL: 23 mg/dL (ref 0.0–40.0)

## 2013-01-02 LAB — SEDIMENTATION RATE: Sed Rate: 6 mm/hr (ref 0–22)

## 2013-01-02 NOTE — Telephone Encounter (Signed)
If Dr Reginal Lutes does not feel these are necessary; they can be deferred for present

## 2013-01-02 NOTE — Telephone Encounter (Signed)
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 Patient wold like to know if he should proceed with Echo and Carotid U/S based on this diagnosis.  Hopp please advise

## 2013-01-03 ENCOUNTER — Other Ambulatory Visit: Payer: Self-pay | Admitting: Internal Medicine

## 2013-01-03 ENCOUNTER — Other Ambulatory Visit (HOSPITAL_COMMUNITY): Payer: 59

## 2013-01-03 NOTE — Telephone Encounter (Signed)
It is my understanding that he had stopped the statins before  his most recent lipids which  did not show dramatic increase in risk. She does stay off statin but monitor the fasting lipids annually.

## 2013-01-03 NOTE — Telephone Encounter (Signed)
Left message to call office

## 2013-01-03 NOTE — Telephone Encounter (Signed)
Discuss with patient  

## 2013-01-03 NOTE — Telephone Encounter (Signed)
Pravastatin is not currently on medication list, did you want patient to restart pravastatin ?

## 2013-01-04 NOTE — Telephone Encounter (Signed)
Left message to call office

## 2013-01-06 ENCOUNTER — Telehealth: Payer: Self-pay | Admitting: Internal Medicine

## 2013-01-06 NOTE — Telephone Encounter (Signed)
Refill request for pravastatin 20mg . This med is not listed on pt's current med list but is listed in pt's allergies. Please advise.

## 2013-01-06 NOTE — Telephone Encounter (Signed)
lmovm for pt to return call.  

## 2013-01-06 NOTE — Telephone Encounter (Signed)
Please see most recent lipid results; these were excellent. If he were on the pravastatin, these were done; renew it. If he had not been on a statin when these were drawn he can stay off the medication.Please verify

## 2013-01-09 NOTE — Telephone Encounter (Signed)
Left message to call office

## 2013-01-10 ENCOUNTER — Telehealth: Payer: Self-pay

## 2013-01-10 NOTE — Telephone Encounter (Signed)
I have addressed this before; his most recent lipids were very good and at goal. If he were not on the pravastatin when these were drawn he can leave it off and monitor lipids annually. If he were on the pravastatin last lipids were done; it can be renewed for one year.

## 2013-01-10 NOTE — Telephone Encounter (Signed)
[facility] was [facility] to [facility] per patient request. Pravastatin is no longer on patients medication list, Hopp please advise  Patient was previously taking pravastatin 20 mg

## 2013-01-10 NOTE — Telephone Encounter (Signed)
Left message on voicemail for the pharmacy informing them rx will not be filled at this time, last lipid profile was within normal limits

## 2013-01-10 NOTE — Telephone Encounter (Signed)
Discuss with patient  

## 2013-03-29 HISTORY — PX: OTHER SURGICAL HISTORY: SHX169

## 2013-06-28 ENCOUNTER — Other Ambulatory Visit: Payer: Self-pay | Admitting: Internal Medicine

## 2013-06-28 NOTE — Telephone Encounter (Signed)
Prozac refill sent to pharmacy

## 2013-06-29 ENCOUNTER — Telehealth: Payer: Self-pay

## 2013-06-29 NOTE — Telephone Encounter (Addendum)
LM for CB--unable to reach prior to visit  HM reviewed. UTD Due as noted: Flu vaccine tdap PSA WNL 06/2011

## 2013-07-03 ENCOUNTER — Ambulatory Visit (INDEPENDENT_AMBULATORY_CARE_PROVIDER_SITE_OTHER): Payer: Commercial Managed Care - PPO | Admitting: Internal Medicine

## 2013-07-03 ENCOUNTER — Encounter: Payer: Self-pay | Admitting: Internal Medicine

## 2013-07-03 VITALS — BP 121/74 | HR 68 | Temp 98.5°F | Ht 69.5 in | Wt 175.4 lb

## 2013-07-03 DIAGNOSIS — E785 Hyperlipidemia, unspecified: Secondary | ICD-10-CM

## 2013-07-03 DIAGNOSIS — Z23 Encounter for immunization: Secondary | ICD-10-CM

## 2013-07-03 DIAGNOSIS — Z8042 Family history of malignant neoplasm of prostate: Secondary | ICD-10-CM

## 2013-07-03 DIAGNOSIS — Z0000 Encounter for general adult medical examination without abnormal findings: Secondary | ICD-10-CM

## 2013-07-03 LAB — CBC WITH DIFFERENTIAL/PLATELET
Basophils Absolute: 0 10*3/uL (ref 0.0–0.1)
Basophils Relative: 0.5 % (ref 0.0–3.0)
Eosinophils Relative: 2 % (ref 0.0–5.0)
HCT: 43.5 % (ref 39.0–52.0)
Hemoglobin: 15.4 g/dL (ref 13.0–17.0)
Lymphocytes Relative: 34 % (ref 12.0–46.0)
Lymphs Abs: 1.7 10*3/uL (ref 0.7–4.0)
Monocytes Relative: 7.2 % (ref 3.0–12.0)
Neutro Abs: 2.8 10*3/uL (ref 1.4–7.7)
RBC: 4.74 Mil/uL (ref 4.22–5.81)
WBC: 5 10*3/uL (ref 4.5–10.5)

## 2013-07-03 LAB — BASIC METABOLIC PANEL
Calcium: 9.2 mg/dL (ref 8.4–10.5)
GFR: 87.26 mL/min (ref >60.00)
Glucose, Bld: 106 mg/dL — ABNORMAL HIGH (ref 70–99)
Potassium: 4.1 mEq/L (ref 3.5–5.1)
Sodium: 140 mEq/L (ref 135–145)

## 2013-07-03 LAB — PSA: PSA: 0.44 ng/mL (ref 0.10–4.00)

## 2013-07-03 LAB — TSH: TSH: 3.75 u[IU]/mL (ref 0.35–5.50)

## 2013-07-03 LAB — HEPATIC FUNCTION PANEL
ALT: 18 U/L (ref 0–53)
AST: 22 U/L (ref 0–37)
Albumin: 4.5 g/dL (ref 3.5–5.2)
Alkaline Phosphatase: 45 U/L (ref 39–117)
Total Protein: 7.3 g/dL (ref 6.0–8.3)

## 2013-07-03 MED ORDER — FLUOXETINE HCL 10 MG PO CAPS: ORAL_CAPSULE | ORAL | Status: DC

## 2013-07-03 NOTE — Progress Notes (Signed)
  Subjective:    Patient ID: [name] [name], male    DOB: 07/08/1971, 42 y.o.   MRN: [id number]  HPI  He is here for a physical;acute issues denied     Review of Systems He is on a modified heart healthy diet; he exercises 60 minutes 4-5 times per week as stationary bike, elliptical, stair stepper & weight training without symptoms. Specifically he denies chest pain, palpitations, dyspnea, or claudication.  Family history is negative for premature coronary disease. Advanced cholesterol testing reveals his LDL goal is less than 110. He is on [employer]. Significant abdominal symptoms, memory deficit, or myalgias denied.     Objective:   Physical Exam Gen.: Thin ,healthy and well-nourished in appearance. Alert, appropriate and cooperative throughout exam. Head: Normocephalic without obvious abnormalities  Eyes: No corneal or conjunctival inflammation noted. Pupils equal round reactive to light and accommodation.  Extraocular motion intact.  Ears: External  ear exam reveals no significant lesions or deformities. Canals clear .TMs normal. Hearing is grossly normal bilaterally. Nose: External nasal exam reveals no deformity or inflammation. Nasal mucosa are pink and moist. No lesions or exudates noted.  Mouth: Oral mucosa and oropharynx reveal no lesions or exudates. Teeth in good repair. Neck: No deformities, masses, or tenderness noted. Range of motion & Thyroid normal. Lungs: Normal respiratory effort; chest expands symmetrically. Lungs are clear to auscultation without rales, wheezes, or increased work of breathing. Heart: Normal rate and rhythm. Normal S1 and S2. No gallop, click, or rub. S4 w/o murmur. Abdomen: Bowel sounds normal; abdomen soft and nontender. No masses, organomegaly or hernias noted. Genitalia: Genitalia normal except for left varices. Prostate is normal without enlargement, asymmetry, nodularity, or induration.                                    Musculoskeletal/extremities: No deformity or scoliosis noted of  the thoracic or lumbar spine.  No clubbing, cyanosis, edema, or significant extremity  deformity noted. Range of motion normal .Tone & strength  Normal. Joints  reveal minor isolated PIP change. Nail health good. Able to lie down & sit up w/o help. Negative SLR bilaterally Vascular: Carotid, radial artery, dorsalis pedis and  posterior tibial pulses are full and equal. No bruits present. Neurologic: Alert and oriented x3. Deep tendon reflexes symmetrical and normal.        Skin: Intact without suspicious lesions or rashes. Lymph: No cervical, axillary, or inguinal lymphadenopathy present. Psych: Mood and affect are normal. Normally interactive                                                                                        Assessment & Plan:  #1 comprehensive physical exam; no acute findings  Plan: see Orders  & Recommendations

## 2013-07-03 NOTE — Patient Instructions (Signed)
Preventive Health Care: Exercise at least 30-45 minutes a day,  3-4 days a week but < 5 hrs/ week.  Eat a low-fat diet with lots of fruits and vegetables, up to 7-9 servings per day. This would eliminate the need for vitamin supplements. Consume less than 40 grams of sugar (preferably ZERO) per day from foods & drinks with High Fructose Corn Sugar as #1,2,3 or # 4 on label. Health Care Power of Attorney & Living Will. Complete these if not in place ; these place you in charge of your health care decisions.

## 2013-07-05 ENCOUNTER — Ambulatory Visit: Payer: Commercial Managed Care - PPO

## 2013-07-05 DIAGNOSIS — R7309 Other abnormal glucose: Secondary | ICD-10-CM

## 2014-07-04 ENCOUNTER — Ambulatory Visit (INDEPENDENT_AMBULATORY_CARE_PROVIDER_SITE_OTHER): Payer: Commercial Managed Care - PPO | Admitting: Internal Medicine

## 2014-07-04 ENCOUNTER — Encounter: Payer: Self-pay | Admitting: Internal Medicine

## 2014-07-04 VITALS — BP 130/83 | HR 79 | Temp 98.4°F | Ht 69.0 in | Wt 172.4 lb

## 2014-07-04 DIAGNOSIS — Z23 Encounter for immunization: Secondary | ICD-10-CM

## 2014-07-04 DIAGNOSIS — Z0000 Encounter for general adult medical examination without abnormal findings: Secondary | ICD-10-CM

## 2014-07-04 DIAGNOSIS — F419 Anxiety disorder, unspecified: Secondary | ICD-10-CM

## 2014-07-04 HISTORY — DX: Anxiety disorder, unspecified: F41.9

## 2014-07-04 MED ORDER — FLUOXETINE HCL 10 MG PO CAPS: ORAL_CAPSULE | ORAL | Status: DC

## 2014-07-04 NOTE — Patient Instructions (Signed)
Stop by the front desk and schedule labs to be done within few days (fasting)    Please come back to the office in 1 year  for a physical exam. Come back fasting    

## 2014-07-04 NOTE — Assessment & Plan Note (Signed)
Td 03 and today Flu shot today FH Prostate ca--  previous PSAs wnl. DRE normal today; plan-- PSA, if normal recheck in 2 years  Labs Cont healthy lifestyle

## 2014-07-04 NOTE — Progress Notes (Signed)
Pre visit review using our clinic review tool, if applicable. No additional management support is needed unless otherwise documented below in the visit note. 

## 2014-07-04 NOTE — Progress Notes (Signed)
Subjective:    Patient ID: [name] [name], male    DOB: 1970/10/08, 43 y.o.   MRN: [id number]  DOS:  07/04/2014 Type of visit - description : CPX Interval history: Transferring from Dr. Linna Darner. Doing well     ROS No  CP, SOB Denies  nausea, vomiting diarrhea, blood in the stools  (-) cough, sputum production (-) wheezing, chest congestion No dysuria, gross hematuria, difficulty urinating  On meds, anxiety well controlled, no depression No headaches    Past Medical History  Diagnosis Date  . Renal calculus     X1; [facility]  . Lactose intolerance     mild  . Hyperlipidemia     h/o  . Gilbert's syndrome   . 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 Anxiety 07/04/2014    Past Surgical History  Procedure Laterality Date  . Shoulder surgery      impingement & spurs; Dr Noemi Chapel  . Arthoscopy      L knee, Dr Noemi Chapel  . Wisdom tooth extraction    . Flexible sigmoidoscopy  2005    for rectal bleeding; internal hemorrhoids  . Arthroscopic meniscus repair  03-29-13    Dr Noemi Chapel    History   Social History  . Marital Status: Married    Spouse Name: N/A    Number of Children: 2  . 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 Smoking status: Former Smoker    Quit date: 09/21/1992  . Smokeless tobacco: Never Used     Comment: smoked 1987-1994, up to 1 ppd  . Alcohol Use: 8.4 oz/week    14 Cans of beer per week     Comment: socially   . Drug Use: No  . Sexual Activity: Not on file   Other Topics Concern  . Not on file   Social History Narrative   Household : pt, wife, 35 y/o and 4 y/o     Family History  Problem Relation Age of Onset  . Irritable bowel syndrome Mother   . Prostate cancer Father 28  .  Nephrolithiasis Father   . Cancer Maternal Grandmother     ? primary  . Breast cancer Paternal Grandmother      lymphatic mets  . Cancer Paternal Grandfather     blood  . Diabetes Paternal Grandfather   . Cancer Other     liver  . Heart attack Maternal Grandfather 92  . Stroke Neg Hx        Medication List       This list is accurate as of: 07/04/14  7:00 PM.  Always use your most recent med list.               aspirin 81 MG tablet  Take 81 mg by mouth daily.     FLUoxetine 10 MG capsule  Commonly known as:  PROZAC  TAKE ONE CAPSULE BY MOUTH ONE TIME DAILY     RED YEAST RICE PO  Take 2 capsules by mouth daily.           Objective:   Physical Exam BP 130/83  Pulse 79  Temp(Src) 98.4 F (36.9 C) (Oral)  Ht 5\' 9"  (1.753 m)  Wt 172 lb 6 oz (78.189  kg)  BMI 25.44 kg/m2  SpO2 100% General -- alert, well-developed, NAD.  Neck --no thyromegaly , normal carotid pulse  HEENT-- Not pale.  Lungs -- normal respiratory effort, no intercostal retractions, no accessory muscle use, and normal breath sounds.  Heart-- normal rate, regular rhythm, no murmur.  Abdomen-- Not distended, good bowel sounds,soft, non-tender. No rebound or rigidity.   Rectal-- No external abnormalities noted. Normal sphincter tone. No rectal masses or tenderness. No stool.  Prostate--Prostate gland firm and smooth, no enlargement, nodularity, tenderness, mass, asymmetry or induration. Extremities-- no pretibial edema bilaterally  Neurologic--  alert & oriented X3. Speech normal, gait appropriate for age, strength symmetric and appropriate for age.  Psych-- Cognition and judgment appear intact. Cooperative with normal attention span and concentration. No anxious or depressed appearing.     Assessment & Plan:

## 2014-07-11 ENCOUNTER — Other Ambulatory Visit (INDEPENDENT_AMBULATORY_CARE_PROVIDER_SITE_OTHER): Payer: Commercial Managed Care - PPO

## 2014-07-11 DIAGNOSIS — Z0000 Encounter for general adult medical examination without abnormal findings: Secondary | ICD-10-CM

## 2014-07-11 LAB — PSA: PSA: 0.49 ng/mL (ref 0.10–4.00)

## 2014-07-12 LAB — LIPID PANEL
CHOLESTEROL: 221 mg/dL — AB (ref 0–200)
HDL: 46.3 mg/dL (ref >39.00)
LDL Cholesterol: 138 mg/dL — ABNORMAL HIGH (ref 0–99)
NonHDL: 174.7
Total CHOL/HDL Ratio: 5
Triglycerides: 183 mg/dL — ABNORMAL HIGH (ref 0.0–149.0)
VLDL: 36.6 mg/dL (ref 0.0–40.0)

## 2014-07-12 LAB — COMPREHENSIVE METABOLIC PANEL
ALBUMIN: 4 g/dL (ref 3.5–5.2)
ALT: 26 U/L (ref 0–53)
AST: 35 U/L (ref 0–37)
Alkaline Phosphatase: 44 U/L (ref 39–117)
BUN: 15 mg/dL (ref 6–23)
CHLORIDE: 103 meq/L (ref 96–112)
CO2: 28 mEq/L (ref 19–32)
Calcium: 9.3 mg/dL (ref 8.4–10.5)
Creatinine, Ser: 1.1 mg/dL (ref 0.4–1.5)
GFR: 79.4 mL/min (ref >60.00)
Glucose, Bld: 96 mg/dL (ref 70–99)
POTASSIUM: 4.3 meq/L (ref 3.5–5.1)
SODIUM: 139 meq/L (ref 135–145)
Total Bilirubin: 1 mg/dL (ref 0.2–1.2)
Total Protein: 7.3 g/dL (ref 6.0–8.3)

## 2014-09-03 ENCOUNTER — Telehealth: Payer: Self-pay | Admitting: *Deleted

## 2014-09-03 NOTE — Telephone Encounter (Signed)
Received paperwork from [facility] for Wellness Program. Form forwarded to Dr. Larose Kells. JG//CMA

## 2014-09-03 NOTE — Telephone Encounter (Signed)
Form signed by Dr. Larose Kells and given to patient. JG//CMA

## 2015-07-09 ENCOUNTER — Other Ambulatory Visit: Payer: Self-pay | Admitting: Internal Medicine

## 2015-07-10 ENCOUNTER — Encounter: Payer: Commercial Managed Care - PPO | Admitting: Internal Medicine

## 2018-02-03 DIAGNOSIS — N489 Disorder of penis, unspecified: Secondary | ICD-10-CM | POA: Diagnosis not present

## 2018-02-03 DIAGNOSIS — Z6826 Body mass index (BMI) 26.0-26.9, adult: Secondary | ICD-10-CM | POA: Diagnosis not present

## 2018-02-04 DIAGNOSIS — L400 Psoriasis vulgaris: Secondary | ICD-10-CM | POA: Diagnosis not present

## 2018-02-04 DIAGNOSIS — D225 Melanocytic nevi of trunk: Secondary | ICD-10-CM | POA: Diagnosis not present

## 2018-02-04 DIAGNOSIS — D1801 Hemangioma of skin and subcutaneous tissue: Secondary | ICD-10-CM | POA: Diagnosis not present

## 2018-02-04 DIAGNOSIS — L821 Other seborrheic keratosis: Secondary | ICD-10-CM | POA: Diagnosis not present

## 2018-03-08 DIAGNOSIS — N200 Calculus of kidney: Secondary | ICD-10-CM | POA: Diagnosis not present

## 2018-03-08 DIAGNOSIS — Z125 Encounter for screening for malignant neoplasm of prostate: Secondary | ICD-10-CM | POA: Diagnosis not present

## 2018-03-08 DIAGNOSIS — N486 Induration penis plastica: Secondary | ICD-10-CM | POA: Diagnosis not present

## 2018-06-28 DIAGNOSIS — Z23 Encounter for immunization: Secondary | ICD-10-CM | POA: Diagnosis not present

## 2018-08-02 DIAGNOSIS — Z125 Encounter for screening for malignant neoplasm of prostate: Secondary | ICD-10-CM | POA: Diagnosis not present

## 2018-08-02 DIAGNOSIS — Z0000 Encounter for general adult medical examination without abnormal findings: Secondary | ICD-10-CM | POA: Diagnosis not present

## 2018-08-02 DIAGNOSIS — R7301 Impaired fasting glucose: Secondary | ICD-10-CM | POA: Diagnosis not present

## 2018-08-02 DIAGNOSIS — R82998 Other abnormal findings in urine: Secondary | ICD-10-CM | POA: Diagnosis not present

## 2018-08-05 DIAGNOSIS — Z1212 Encounter for screening for malignant neoplasm of rectum: Secondary | ICD-10-CM | POA: Diagnosis not present

## 2018-08-09 DIAGNOSIS — Z1389 Encounter for screening for other disorder: Secondary | ICD-10-CM | POA: Diagnosis not present

## 2018-08-09 DIAGNOSIS — R5383 Other fatigue: Secondary | ICD-10-CM | POA: Diagnosis not present

## 2018-08-09 DIAGNOSIS — R7301 Impaired fasting glucose: Secondary | ICD-10-CM | POA: Diagnosis not present

## 2018-08-09 DIAGNOSIS — M79644 Pain in right finger(s): Secondary | ICD-10-CM | POA: Diagnosis not present

## 2018-08-09 DIAGNOSIS — Z0000 Encounter for general adult medical examination without abnormal findings: Secondary | ICD-10-CM | POA: Diagnosis not present

## 2018-08-09 DIAGNOSIS — M79674 Pain in right toe(s): Secondary | ICD-10-CM | POA: Diagnosis not present

## 2018-08-25 DIAGNOSIS — M79644 Pain in right finger(s): Secondary | ICD-10-CM | POA: Diagnosis not present

## 2018-08-25 DIAGNOSIS — M79674 Pain in right toe(s): Secondary | ICD-10-CM | POA: Diagnosis not present

## 2019-08-15 DIAGNOSIS — E7849 Other hyperlipidemia: Secondary | ICD-10-CM | POA: Diagnosis not present

## 2019-08-15 DIAGNOSIS — R7301 Impaired fasting glucose: Secondary | ICD-10-CM | POA: Diagnosis not present

## 2019-08-15 DIAGNOSIS — N529 Male erectile dysfunction, unspecified: Secondary | ICD-10-CM | POA: Diagnosis not present

## 2019-08-15 DIAGNOSIS — Z125 Encounter for screening for malignant neoplasm of prostate: Secondary | ICD-10-CM | POA: Diagnosis not present

## 2019-08-15 DIAGNOSIS — Z0000 Encounter for general adult medical examination without abnormal findings: Secondary | ICD-10-CM | POA: Diagnosis not present

## 2019-08-16 DIAGNOSIS — R82998 Other abnormal findings in urine: Secondary | ICD-10-CM | POA: Diagnosis not present

## 2019-08-22 DIAGNOSIS — N486 Induration penis plastica: Secondary | ICD-10-CM | POA: Diagnosis not present

## 2019-08-22 DIAGNOSIS — Z1331 Encounter for screening for depression: Secondary | ICD-10-CM | POA: Diagnosis not present

## 2019-08-22 DIAGNOSIS — N529 Male erectile dysfunction, unspecified: Secondary | ICD-10-CM | POA: Diagnosis not present

## 2019-08-22 DIAGNOSIS — R7301 Impaired fasting glucose: Secondary | ICD-10-CM | POA: Diagnosis not present

## 2019-08-22 DIAGNOSIS — E785 Hyperlipidemia, unspecified: Secondary | ICD-10-CM | POA: Diagnosis not present

## 2019-08-22 DIAGNOSIS — Z0000 Encounter for general adult medical examination without abnormal findings: Secondary | ICD-10-CM | POA: Diagnosis not present

## 2019-09-13 ENCOUNTER — Other Ambulatory Visit: Payer: Self-pay

## 2019-09-13 ENCOUNTER — Ambulatory Visit: Payer: BC Managed Care – PPO | Attending: Internal Medicine

## 2019-09-13 DIAGNOSIS — Z20828 Contact with and (suspected) exposure to other viral communicable diseases: Secondary | ICD-10-CM | POA: Diagnosis not present

## 2019-09-13 DIAGNOSIS — Z20822 Contact with and (suspected) exposure to covid-19: Secondary | ICD-10-CM

## 2019-09-14 LAB — NOVEL CORONAVIRUS, NAA: SARS-CoV-2, NAA: NOT DETECTED

## 2019-12-22 ENCOUNTER — Ambulatory Visit: Payer: BC Managed Care – PPO | Attending: Internal Medicine

## 2019-12-22 DIAGNOSIS — Z23 Encounter for immunization: Secondary | ICD-10-CM

## 2019-12-22 NOTE — Progress Notes (Signed)
   Covid-19 Vaccination Clinic  Name:  [name] [name]    MRN: [id number] DOB: [birth year]  12/22/2019  [name] was observed post Covid-19 immunization for 15 minutes without incident. He was provided with Vaccine Information Sheet and instruction to access the V-Safe system.   Mr. Living was instructed to call 911 with any severe reactions post vaccine: Marland Kitchen Difficulty breathing  . Swelling of face and throat  . A fast heartbeat  . A bad rash all over body  . Dizziness and weakness   Immunizations Administered    Name Date Dose VIS Date Route   Pfizer COVID-19 Vaccine 12/22/2019  3:11 PM 0.3 mL 09/01/2019 Intramuscular   Manufacturer: [employer], [employer]   Lot: [id number]   [place]: [id number]

## 2020-01-15 ENCOUNTER — Ambulatory Visit: Payer: BC Managed Care – PPO | Attending: Internal Medicine

## 2020-01-15 DIAGNOSIS — Z23 Encounter for immunization: Secondary | ICD-10-CM

## 2020-01-15 NOTE — Progress Notes (Signed)
   Covid-19 Vaccination Clinic  Name:  [name] [name]    MRN: [id number] DOB: 11-17-1970  01/15/2020  [name] was observed post Covid-19 immunization for 15 minutes without incident. He was provided with Vaccine Information Sheet and instruction to access the V-Safe system.   [name] was instructed to call 911 with any severe reactions post vaccine: Marland Kitchen Difficulty breathing  . Swelling of face and throat  . A fast heartbeat  . A bad rash all over body  . Dizziness and weakness   Immunizations Administered    Name Date Dose VIS Date Route   Pfizer COVID-19 Vaccine 01/15/2020  2:39 PM 0.3 mL 11/15/2018 Intramuscular   Manufacturer: [place]   Lot: [id number]   [place]: [id number]

## 2020-08-23 DIAGNOSIS — E785 Hyperlipidemia, unspecified: Secondary | ICD-10-CM | POA: Diagnosis not present

## 2020-08-23 DIAGNOSIS — R7301 Impaired fasting glucose: Secondary | ICD-10-CM | POA: Diagnosis not present

## 2020-08-23 DIAGNOSIS — Z125 Encounter for screening for malignant neoplasm of prostate: Secondary | ICD-10-CM | POA: Diagnosis not present

## 2020-08-23 DIAGNOSIS — Z0000 Encounter for general adult medical examination without abnormal findings: Secondary | ICD-10-CM | POA: Diagnosis not present

## 2020-08-23 DIAGNOSIS — N529 Male erectile dysfunction, unspecified: Secondary | ICD-10-CM | POA: Diagnosis not present

## 2020-08-30 DIAGNOSIS — Z23 Encounter for immunization: Secondary | ICD-10-CM | POA: Diagnosis not present

## 2020-08-30 DIAGNOSIS — Z0000 Encounter for general adult medical examination without abnormal findings: Secondary | ICD-10-CM | POA: Diagnosis not present

## 2020-08-30 DIAGNOSIS — Z1331 Encounter for screening for depression: Secondary | ICD-10-CM | POA: Diagnosis not present

## 2021-01-08 ENCOUNTER — Ambulatory Visit (AMBULATORY_SURGERY_CENTER): Payer: BC Managed Care – PPO

## 2021-01-08 ENCOUNTER — Other Ambulatory Visit: Payer: Self-pay

## 2021-01-08 VITALS — Ht 68.0 in | Wt 170.0 lb

## 2021-01-08 DIAGNOSIS — Z1211 Encounter for screening for malignant neoplasm of colon: Secondary | ICD-10-CM

## 2021-01-08 MED ORDER — NA SULFATE-K SULFATE-MG SULF 17.5-3.13-1.6 GM/177ML PO SOLN: 1.0000 | Freq: Once | ORAL | 0 refills | Status: AC

## 2021-01-08 NOTE — Progress Notes (Signed)
Pre visit completed via phone call; Patient verified name, DOB, and address; No egg or soy allergy known to patient  No issues with past sedation with any surgeries or procedures Patient denies ever being told they had issues or difficulty with intubation  No FH of Malignant Hyperthermia No diet pills per patient No home 02 use per patient  No blood thinners per patient  Pt denies issues with constipation  No A fib or A flutter  EMMI video via MyChart  COVID 19 guidelines implemented in PV today with Pt and RN  Pt is fully vaccinated for Covid x 2; NO PA's for preps discussed with pt in PV today  Discussed with pt there will be an out-of-pocket cost for prep and that varies from $0 to 70 dollars  Due to the COVID-19 pandemic we are asking patients to follow certain guidelines.  Pt aware of COVID protocols and [place] guidelines  Patient has requested that the prep instructions be sent to MyChart accoutn and also be sent to the 3rd floor for pick up

## 2021-01-16 ENCOUNTER — Encounter: Payer: Self-pay | Admitting: Gastroenterology

## 2021-01-16 ENCOUNTER — Other Ambulatory Visit: Payer: Self-pay

## 2021-01-16 ENCOUNTER — Ambulatory Visit (AMBULATORY_SURGERY_CENTER): Payer: BC Managed Care – PPO | Admitting: Gastroenterology

## 2021-01-16 VITALS — BP 102/68 | HR 57 | Temp 96.8°F | Resp 8 | Ht 68.0 in | Wt 170.0 lb

## 2021-01-16 DIAGNOSIS — D122 Benign neoplasm of ascending colon: Secondary | ICD-10-CM | POA: Diagnosis not present

## 2021-01-16 DIAGNOSIS — D120 Benign neoplasm of cecum: Secondary | ICD-10-CM | POA: Diagnosis not present

## 2021-01-16 DIAGNOSIS — Z1211 Encounter for screening for malignant neoplasm of colon: Secondary | ICD-10-CM

## 2021-01-16 MED ORDER — SODIUM CHLORIDE 0.9 % IV SOLN: 500.0000 mL | Freq: Once | INTRAVENOUS | Status: AC

## 2021-01-16 NOTE — Patient Instructions (Signed)
Impression/Recommendations:  [place] handout given to patient.  Resume previous diet. Continue present medications. Await pathology results.  Repeat colonoscopy recommended for surveillance.  Date to be determined after pathology results reviewed.  YOU HAD AN ENDOSCOPIC PROCEDURE TODAY AT THE [facility]:   Refer to the procedure report that was given to you for any specific questions about what was found during the examination.  If the procedure report does not answer your questions, please call your gastroenterologist to clarify.  If you requested that your care partner not be given the details of your procedure findings, then the procedure report has been included in a sealed envelope for you to review at your convenience later.  YOU SHOULD EXPECT: Some feelings of bloating in the abdomen. Passage of more gas than usual.  Walking can help get rid of the air that was put into your GI tract during the procedure and reduce the bloating. If you had a lower endoscopy (such as a colonoscopy or flexible sigmoidoscopy) you may notice spotting of blood in your stool or on the toilet paper. If you underwent a bowel prep for your procedure, you may not have a normal bowel movement for a few days.  Please Note:  You might notice some irritation and congestion in your nose or some drainage.  This is from the oxygen used during your procedure.  There is no need for concern and it should clear up in a day or so.  SYMPTOMS TO REPORT IMMEDIATELY:  Following lower endoscopy (colonoscopy or flexible sigmoidoscopy):  Excessive amounts of blood in the stool  Significant tenderness or worsening of abdominal pains  Swelling of the abdomen that is new, acute  Fever of 100F or higher For urgent or emergent issues, a gastroenterologist can be reached at any hour by calling [phone]. Do not use MyChart messaging for urgent concerns.    DIET:  We do recommend a small meal at first, but then you  may proceed to your regular diet.  Drink plenty of fluids but you should avoid alcoholic beverages for 24 hours.  ACTIVITY:  You should plan to take it easy for the rest of today and you should NOT DRIVE or use heavy machinery until tomorrow (because of the sedation medicines used during the test).    FOLLOW UP: Our staff will call the number listed on your records 48-72 hours following your procedure to check on you and address any questions or concerns that you may have regarding the information given to you following your procedure. If we do not reach you, we will leave a message.  We will attempt to reach you two times.  During this call, we will ask if you have developed any symptoms of COVID 19. If you develop any symptoms (ie: fever, flu-like symptoms, shortness of breath, cough etc.) before then, please call [phone].  If you test positive for Covid 19 in the 2 weeks post procedure, please call and report this information to us.    If any biopsies were taken you will be contacted by phone or by letter within the next 1-3 weeks.  Please call us at [phone] if you have not heard about the biopsies in 3 weeks.    SIGNATURES/CONFIDENTIALITY: You and/or your care partner have signed paperwork which will be entered into your electronic medical record.  These signatures attest to the fact that that the information above on your After Visit Summary has been reviewed and is understood.  Full responsibility of   the confidentiality of this discharge information lies with you and/or your care-partner.  

## 2021-01-16 NOTE — Op Note (Signed)
[facility] Patient Name: [name] [name] Procedure Date: 01/16/2021 2:02 PM MRN: [id number] Endoscopist: Mallie Mussel L. Loletha Carrow , MD Age: 50 Referring MD:  Date of Birth: 11-Mar-1971 Gender: Male Account #: [id number] Procedure:                Colonoscopy Indications:              Screening for colorectal malignant neoplasm, This                            is the patient's first colonoscopy Medicines:                Monitored Anesthesia Care Procedure:                Pre-Anesthesia Assessment:                           - Prior to the procedure, a History and Physical                            was performed, and patient medications and                            allergies were reviewed. The patient's tolerance of                            previous anesthesia was also reviewed. The risks                            and benefits of the procedure and the sedation                            options and risks were discussed with the patient.                            All questions were answered, and informed consent                            was obtained. Prior Anticoagulants: The patient has                            taken no previous anticoagulant or antiplatelet                            agents. ASA Grade Assessment: II - A patient with                            mild systemic disease. After reviewing the risks                            and benefits, the patient was deemed in                            satisfactory condition to undergo the procedure.  After obtaining informed consent, the colonoscope                            was passed under direct vision. Throughout the                            procedure, the patient's blood pressure, pulse, and                            oxygen saturations were monitored continuously. The                            Olympus CF-HQ190L ([id number]) Colonoscope was                            introduced through the anus  and advanced to the the                            cecum, identified by appendiceal orifice and                            ileocecal valve. The colonoscopy was performed                            without difficulty. The patient tolerated the                            procedure well. The quality of the bowel                            preparation was excellent. The ileocecal valve,                            appendiceal orifice, and rectum were photographed.                            The bowel preparation used was SUPREP. Scope In: 2:14:16 PM Scope Out: 2:30:29 PM Scope Withdrawal Time: 0 hours 11 minutes 47 seconds  Total Procedure Duration: 0 hours 16 minutes 13 seconds  Findings:                 The perianal and digital rectal examinations were                            normal.                           Two sessile polyps were found in the hepatic                            flexure and cecum. The polyps were 3 to 5 mm in                            size. These polyps were removed with a cold snare.  Resection and retrieval were complete.                           Anal papilla(e) were hypertrophied.                           The exam was otherwise without abnormality on                            direct and retroflexion views. Complications:            No immediate complications. Estimated Blood Loss:     Estimated blood loss was minimal. Impression:               - Two 3 to 5 mm polyps at the hepatic flexure and                            in the cecum, removed with a cold snare. Resected                            and retrieved.                           - Anal papilla(e) were hypertrophied.                           - The examination was otherwise normal on direct                            and retroflexion views. Recommendation:           - Patient has a contact number available for                            emergencies. The signs and symptoms of  potential                            delayed complications were discussed with the                            patient. Return to normal activities tomorrow.                            Written discharge instructions were provided to the                            patient.                           - Resume previous diet.                           - Continue present medications.                           - Await pathology results.                           -  Repeat colonoscopy is recommended for                            surveillance. The colonoscopy date will be                            determined after pathology results from today's                            exam become available for review. Cailynn Bodnar L. Loletha Carrow, MD 01/16/2021 2:38:24 PM This report has been signed electronically.

## 2021-01-16 NOTE — Progress Notes (Signed)
To PACU, VSS. Report to [email or web address] 

## 2021-01-16 NOTE — Progress Notes (Signed)
N.S. vital signs. 

## 2021-01-16 NOTE — Progress Notes (Signed)
Called to room to assist during endoscopic procedure.  Patient ID and intended procedure confirmed with present staff. Received instructions for my participation in the procedure from the performing physician.  

## 2021-01-16 NOTE — Progress Notes (Signed)
Pt's states no medical or surgical changes since previsit or office visit. 

## 2021-01-20 ENCOUNTER — Telehealth: Payer: Self-pay

## 2021-01-20 ENCOUNTER — Telehealth: Payer: Self-pay | Admitting: *Deleted

## 2021-01-20 DIAGNOSIS — M79644 Pain in right finger(s): Secondary | ICD-10-CM | POA: Diagnosis not present

## 2021-01-20 DIAGNOSIS — M13841 Other specified arthritis, right hand: Secondary | ICD-10-CM | POA: Diagnosis not present

## 2021-01-20 DIAGNOSIS — M79645 Pain in left finger(s): Secondary | ICD-10-CM | POA: Diagnosis not present

## 2021-01-20 NOTE — Telephone Encounter (Signed)
  Follow up Call-  Call back number 01/16/2021  Post procedure Call Back phone  # [phone]  Permission to leave phone message Yes  Some recent data might be hidden     Patient questions:  Do you have a fever, pain , or abdominal swelling? No. Pain Score  0 *  Have you tolerated food without any problems? Yes.    Have you been able to return to your normal activities? Yes.    Do you have any questions about your discharge instructions: Diet   No. Medications  No. Follow up visit  No.  Do you have questions or concerns about your Care? No.  Actions: * If pain score is 4 or above: No action needed, pain <4.  1. Have you developed a fever since your procedure? no  2.   Have you had an respiratory symptoms (SOB or cough) since your procedure? no  3.   Have you tested positive for COVID 19 since your procedure no  4.   Have you had any family members/close contacts diagnosed with the COVID 19 since your procedure?  no   If yes to any of these questions please route to Joylene John, RN and Joella Prince, RN

## 2021-01-20 NOTE — Telephone Encounter (Signed)
Attempted to reach patient for post-procedure f/u call. No answer. Left message that we will make another attempt to reach him later today and for him to please not hesitate to call us if he has any questions/concerns regarding his care. 

## 2021-01-27 ENCOUNTER — Encounter: Payer: Self-pay | Admitting: Gastroenterology

## 2021-02-07 ENCOUNTER — Encounter: Payer: BC Managed Care – PPO | Admitting: Gastroenterology

## 2021-08-04 DIAGNOSIS — M13842 Other specified arthritis, left hand: Secondary | ICD-10-CM | POA: Diagnosis not present

## 2021-08-04 DIAGNOSIS — M65351 Trigger finger, right little finger: Secondary | ICD-10-CM | POA: Diagnosis not present

## 2021-08-04 DIAGNOSIS — M13841 Other specified arthritis, right hand: Secondary | ICD-10-CM | POA: Diagnosis not present

## 2021-09-19 DIAGNOSIS — Z125 Encounter for screening for malignant neoplasm of prostate: Secondary | ICD-10-CM | POA: Diagnosis not present

## 2021-09-19 DIAGNOSIS — R7301 Impaired fasting glucose: Secondary | ICD-10-CM | POA: Diagnosis not present

## 2021-09-19 DIAGNOSIS — E785 Hyperlipidemia, unspecified: Secondary | ICD-10-CM | POA: Diagnosis not present

## 2021-09-26 DIAGNOSIS — H0288A Meibomian gland dysfunction right eye, upper and lower eyelids: Secondary | ICD-10-CM | POA: Diagnosis not present

## 2021-09-26 DIAGNOSIS — H16223 Keratoconjunctivitis sicca, not specified as Sjogren's, bilateral: Secondary | ICD-10-CM | POA: Diagnosis not present

## 2021-09-26 DIAGNOSIS — H0288B Meibomian gland dysfunction left eye, upper and lower eyelids: Secondary | ICD-10-CM | POA: Diagnosis not present

## 2021-09-30 DIAGNOSIS — R7301 Impaired fasting glucose: Secondary | ICD-10-CM | POA: Diagnosis not present

## 2021-09-30 DIAGNOSIS — Z0000 Encounter for general adult medical examination without abnormal findings: Secondary | ICD-10-CM | POA: Diagnosis not present

## 2021-09-30 DIAGNOSIS — R82998 Other abnormal findings in urine: Secondary | ICD-10-CM | POA: Diagnosis not present

## 2021-10-02 ENCOUNTER — Other Ambulatory Visit: Payer: Self-pay | Admitting: Internal Medicine

## 2021-10-02 DIAGNOSIS — E785 Hyperlipidemia, unspecified: Secondary | ICD-10-CM

## 2021-10-28 ENCOUNTER — Ambulatory Visit
Admission: RE | Admit: 2021-10-28 | Discharge: 2021-10-28 | Disposition: A | Discharge status: Home / Self Care | Payer: No Typology Code available for payment source | Source: Ambulatory Visit | Attending: Internal Medicine | Admitting: Internal Medicine

## 2021-10-28 DIAGNOSIS — E785 Hyperlipidemia, unspecified: Secondary | ICD-10-CM | POA: Diagnosis not present

## 2021-12-05 ENCOUNTER — Ambulatory Visit: Payer: BC Managed Care – PPO | Admitting: Interventional Cardiology

## 2021-12-05 ENCOUNTER — Other Ambulatory Visit: Payer: Self-pay

## 2021-12-05 ENCOUNTER — Encounter: Payer: Self-pay | Admitting: *Deleted

## 2021-12-05 ENCOUNTER — Encounter: Payer: Self-pay | Admitting: Interventional Cardiology

## 2021-12-05 VITALS — BP 132/90 | HR 85 | Ht 68.75 in | Wt 177.0 lb

## 2021-12-05 DIAGNOSIS — F419 Anxiety disorder, unspecified: Secondary | ICD-10-CM

## 2021-12-05 DIAGNOSIS — I2510 Atherosclerotic heart disease of native coronary artery without angina pectoris: Secondary | ICD-10-CM | POA: Diagnosis not present

## 2021-12-05 DIAGNOSIS — E782 Mixed hyperlipidemia: Secondary | ICD-10-CM

## 2021-12-05 DIAGNOSIS — I2584 Coronary atherosclerosis due to calcified coronary lesion: Secondary | ICD-10-CM

## 2021-12-05 NOTE — Patient Instructions (Signed)
Medication Instructions:  ?Your physician recommends that you continue on your current medications as directed. Please refer to the Current Medication list given to you today. ? ?*If you need a refill on your cardiac medications before your next appointment, please call your pharmacy* ? ? ?Lab Work: ?none ?If you have labs (blood work) drawn today and your tests are completely normal, you will receive your results only by: ?MyChart Message (if you have MyChart) OR ?A paper copy in the mail ?If you have any lab test that is abnormal or we need to change your treatment, we will call you to review the results. ? ? ?Testing/Procedures: ?Your physician has requested that you have en exercise stress myoview. For further information please visit [email or web address]. Please follow instruction sheet, as given.  ? ? ?Follow-Up: ?At [facility], you and your health needs are our priority.  As part of our continuing mission to provide you with exceptional heart care, we have created designated Provider Care Teams.  These Care Teams include your primary Cardiologist (physician) and Advanced Practice Providers (APPs -  Physician Assistants and Nurse Practitioners) who all work together to provide you with the care you need, when you need it. ? ?We recommend signing up for the patient portal called "MyChart".  Sign up information is provided on this After Visit Summary.  MyChart is used to connect with patients for Virtual Visits (Telemedicine).  Patients are able to view lab/test results, encounter notes, upcoming appointments, etc.  Non-urgent messages can be sent to your provider as well.   ?To learn more about what you can do with MyChart, go to [email or web address].   ? ?Your next appointment:   ?12 month(s) ? ?The format for your next appointment:   ?In Person ? ?Provider:   ?Larae Grooms, MD   ? ? ?Other Instructions ?High-Fiber Eating Plan ?Fiber, also called dietary fiber, is a type of carbohydrate. It is found  foods such as fruits, vegetables, whole grains, and beans. A high-fiber diet can have many health benefits. Your health care provider may recommend a high-fiber diet to help: ?Prevent constipation. Fiber can make your bowel movements more regular. ?Lower your cholesterol. ?Relieve the following conditions: ?Inflammation of veins in the anus (hemorrhoids). ?Inflammation of specific areas of the digestive tract (uncomplicated diverticulosis). ?A problem of the large intestine, also called the colon, that sometimes causes pain and diarrhea (irritable bowel syndrome, or IBS). ?Prevent overeating as part of a weight-loss plan. ?Prevent heart disease, type 2 diabetes, and certain cancers. ?What are tips for following this plan? ?Reading food labels ? ?Check the nutrition facts label on food products for the amount of dietary fiber. Choose foods that have 5 grams of fiber or more per serving. ?The goals for recommended daily fiber intake include: ?Men (age 21 or younger): 34-38 g. ?Men (over age 54): 28-34 g. ?Women (age 71 or younger): 25-28 g. ?Women (over age 31): 22-25 g. ?Your daily fiber goal is _____________ g. ?Shopping ?Choose whole fruits and vegetables instead of processed forms, such as apple juice or applesauce. ?Choose a wide variety of high-fiber foods such as avocados, lentils, oats, and kidney beans. ?Read the nutrition facts label of the foods you choose. Be aware of foods with added fiber. These foods often have high sugar and sodium amounts per serving. ?Cooking ?Use whole-grain flour for baking and cooking. ?Cook with brown rice instead of white rice. ?Meal planning ?Start the day with a breakfast that is high in fiber, such as a cereal  that contains 5 g of fiber or more per serving. ?Eat breads and cereals that are made with whole-grain flour instead of refined flour or white flour. ?Eat brown rice, bulgur wheat, or millet instead of white rice. ?Use beans in place of meat in soups, salads, and pasta  dishes. ?Be sure that half of the grains you eat each day are whole grains. ?General information ?You can get the recommended daily intake of dietary fiber by: ?Eating a variety of fruits, vegetables, grains, nuts, and beans. ?Taking a fiber supplement if you are not able to take in enough fiber in your diet. It is better to get fiber through food than from a supplement. ?Gradually increase how much fiber you consume. If you increase your intake of dietary fiber too quickly, you may have bloating, cramping, or gas. ?Drink plenty of water to help you digest fiber. ?Choose high-fiber snacks, such as berries, raw vegetables, nuts, and popcorn. ?What foods should I eat? ?Fruits ?Berries. Pears. Apples. Oranges. Avocado. Prunes and raisins. Dried figs. ?Vegetables ?Sweet potatoes. Spinach. Kale. Artichokes. Cabbage. Broccoli. Cauliflower. Green peas. Carrots. Squash. ?Grains ?Whole-grain breads. Multigrain cereal. Oats and oatmeal. Brown rice. Barley. Bulgur wheat. [place]. Quinoa. Bran muffins. Popcorn. Rye wafer crackers. ?Meats and other proteins ?Navy beans, kidney beans, and pinto beans. Soybeans. Split peas. Lentils. Nuts and seeds. ?Dairy ?Fiber-fortified yogurt. ?Beverages ?Fiber-fortified soy milk. Fiber-fortified orange juice. ?Other foods ?Fiber bars. ?The items listed above may not be a complete list of recommended foods and beverages. Contact a dietitian for more information. ?What foods should I avoid? ?Fruits ?Fruit juice. Cooked, strained fruit. ?Vegetables ?Fried potatoes. Canned vegetables. Well-cooked vegetables. ?Grains ?White bread. Pasta made with refined flour. White rice. ?Meats and other proteins ?Fatty cuts of meat. Fried chicken or fried fish. ?Dairy ?Milk. Yogurt. Cream cheese. Sour cream. ?Fats and oils ?Butters. ?Beverages ?Soft drinks. ?Other foods ?Cakes and pastries. ?The items listed above may not be a complete list of foods and beverages to avoid. Talk with your dietitian about what  choices are best for you. ?Summary ?Fiber is a type of carbohydrate. It is found in foods such as fruits, vegetables, whole grains, and beans. ?A high-fiber diet has many benefits. It can help to prevent constipation, lower blood cholesterol, aid weight loss, and reduce your risk of heart disease, diabetes, and certain cancers. ?Increase your intake of fiber gradually. Increasing fiber too quickly may cause cramping, bloating, and gas. Drink plenty of water while you increase the amount of fiber you consume. ?The best sources of fiber include whole fruits and vegetables, whole grains, nuts, seeds, and beans. ?This information is not intended to replace advice given to you by your health care provider. Make sure you discuss any questions you have with your health care provider. ?Document Revised: 01/11/2020 Document Reviewed: 01/11/2020 ?Elsevier Patient Education ? 2022 [place]. ? ? ?

## 2021-12-05 NOTE — Progress Notes (Signed)
?  ?Cardiology Office Note ? ? ?Date:  12/05/2021  ? ?ID:  [name] [name], DOB May 05, 1971, MRN [id number] ? ?PCP:  Donnajean Lopes, MD  ? ? ?No chief complaint on file. ? ?CAD ? ?Wt Readings from Last 3 Encounters:  ?12/05/21 177 lb (80.3 kg)  ?01/16/21 170 lb (77.1 kg)  ?01/08/21 170 lb (77.1 kg)  ?  ? ?  ?History of Present Illness: ?[name] [name] is a 51 y.o. male who is being seen today for the evaluation of coronary calcification at the request of Donnajean Lopes, MD.  ? ?Dr. Sharlett Iles prescribed a statin.  He had been on and off statins since 2013.  Had feet and ankle pain.  He tried red yeast rice, fish oil, healthy diet. ? ?CT scan of chest showed three-vessel coronary artery calcification and his score was in the 88th percentile.: ?"Left Main: 13 ?  ?LAD: 37 ?  ?LCx: 13 ?  ?RCA: 68 ?  ?CORONARY CALCIUM ?  ?Total Agatston Score: 129 ?  ?MESA database percentile: 70 ?  ?Ascending aorta (normal <  40 mm): 34 mm" ? ?Denies : exertional Chest pain. Dizziness. Leg edema. Nitroglycerin use. Orthopnea. Paroxysmal nocturnal dyspnea. Shortness of breath. Syncope.   ? ?More anxiety since the calcium score result.  Feels "panicky".  No problems with elliptical, walking, weights.  No cardiovascular symptoms. ? ?Rare palpitations for years. ? ?Former smoker, quit smoking in 1996.   ? ?No family h/o CAD.  ?Past Medical History:  ?Diagnosis Date  ? Allergy   ? seasonal allergies  ? Anxiety 07/04/2014  ? PRN  ? Arthritis   ? hands  ? CAD (coronary artery disease)   ? Gilbert's syndrome   ? Hyperlipidemia   ? diet controlled  ? Kidney stone   ? Lactose intolerance   ? mild  ? Low back pain   ? Migraines   ? Ocular migraine 12/29/2012  ? Two episodes early March & 12/28/2012 as "distorted vision " OS Dr Juanda Crumble Richards,Ophthalmology diagnosed Ocular Migraine rather than Amaurosis fugax FH: father ocular migraine & macular degeneration   ? Renal calculus   ? X1; [facility]  ? ? ?Past Surgical History:   ?Procedure Laterality Date  ? arthoscopy Left   ? L knee, Dr Noemi Chapel  ? arthroscopic meniscus repair Right 03/29/2013  ? Dr Noemi Chapel  ? FLEXIBLE SIGMOIDOSCOPY  2005  ? for rectal bleeding; internal hemorrhoids  ? SHOULDER SURGERY Left   ? impingement & spurs; Dr Noemi Chapel  ? WISDOM TOOTH EXTRACTION    ? ? ? ?Current Outpatient Medications  ?Medication Sig Dispense Refill  ? Apoaequorin (PREVAGEN) 10 MG CAPS Take 1 capsule by mouth daily.    ? aspirin EC 81 MG tablet Take 1 tablet by mouth daily.    ? cholecalciferol (VITAMIN D3) 25 MCG (1000 UNIT) tablet Take 1 tablet by mouth daily.    ? co-enzyme Q-10 30 MG capsule Take 30 mg by mouth daily.    ? Misc Natural Products (APPLE CIDER VINEGAR DIET PO) Take 1 tablet by mouth daily.    ? Omega-3 Fatty Acids (FISH OIL) 1000 MG CAPS Take 3 capsules by mouth daily.    ? propranolol (INDERAL) 10 MG tablet Prior to public speaking    ? rosuvastatin (CRESTOR) 20 MG tablet Take 20 mg by mouth daily.    ? sildenafil (REVATIO) 20 MG tablet take 2-5 tablets by mouth up to once daily as needed    ?  Varenicline Tartrate (TYRVAYA) 0.03 MG/ACT SOLN Place 1 spray into the nose daily.    ? ?Current Facility-Administered Medications  ?Medication Dose Route Frequency Provider Last Rate Last Admin  ? 0.9 %  sodium chloride infusion  500 mL Intravenous Once Doran Stabler, MD      ? ? ?Allergies:   Pravastatin and Sildenafil  ? ? ?Social History:  The patient  reports that he quit smoking about 29 years ago. His smoking use included cigarettes. He has been exposed to tobacco smoke. He has never used smokeless tobacco. He reports current alcohol use of about 12.0 standard drinks per week. He reports that he does not use drugs.  ? ?Family History:  The patient's family history includes Atrial fibrillation in his mother; Breast cancer in his paternal grandmother; Cancer in his maternal grandmother, paternal grandfather, and another family member; Diabetes in his paternal grandfather; Heart  attack (age of onset: 108) in his maternal grandfather; Irritable bowel syndrome in his mother; Nephrolithiasis in his father; Prostate cancer (age of onset: 19) in his father; Skin cancer in his father; Skin cancer (age of onset: 24) in his mother.  ? ? ?ROS:  Please see the history of present illness.   Otherwise, review of systems are positive for anxiety.   All other systems are reviewed and negative.  ? ? ?PHYSICAL EXAM: ?VS:  BP 132/90   Pulse 85   Ht 5' 8.75" (1.746 m)   Wt 177 lb (80.3 kg)   SpO2 99%   BMI 26.33 kg/m?  , BMI Body mass index is 26.33 kg/m?. ?GEN: Well nourished, well developed, in no acute distress ?HEENT: normal ?Neck: no JVD, carotid bruits, or masses ?Cardiac: RRR; no murmurs, rubs, or gallops,no edema  ?Respiratory:  clear to auscultation bilaterally, normal work of breathing ?GI: soft, nontender, nondistended, + BS ?MS: no deformity or atrophy ?Skin: warm and dry, no rash ?Neuro:  Strength and sensation are intact ?Psych: euthymic mood, full affect ? ? ?EKG:   ?The ekg ordered today demonstrates NSR, nonspecific ST changes ? ? ?Recent Labs: ?No results found for requested labs within last 8760 hours.  ? ?Lipid Panel ?   ?Component Value Date/Time  ? CHOL 221 (H) 07/11/2014 1914  ? TRIG 183.0 (H) 07/11/2014 7829  ? HDL 46.30 07/11/2014 0832  ? CHOLHDL 5 07/11/2014 0832  ? VLDL 36.6 07/11/2014 0832  ? LDLCALC 138 (H) 07/11/2014 5621  ? LDLDIRECT 148.6 06/29/2011 1536  ? ?  ?Other studies Reviewed: ?Additional studies/ records that were reviewed today with results demonstrating: Labs reviewed total cholesterol 216, HDL 37, LDL 133, triglycerides 229 in December 2022. ? ? ?ASSESSMENT AND PLAN: ? ?Coronary artery calcium: 129 score.  88th percentile.  This has caused some anxiety.  I explained to him that the calcium and plaque that are in his arteries are not necessarily obstructive.  With statin therapy, hopefully the disease can be stabilized.  We will plan for exercise Myoview to  evaluate for obstructive CAD. ?Hyperlipidemia: Tolerating Crestor now, continue the current dose, 20 mg daily.  Needs to follow whole food, plant-based diet.  High-fiber foods.  Avoiding processed foods. ?Anxiety: I think some of the jittery feeling he has had in his chest is related to anxiety.  He is feeling somewhat better.  We spoke also about moderation in terms of diet.  His diet does not have to be perfect but he should be eating a healthy diet in general.  He is already eating  a lot of salads. ? ? ?Current medicines are reviewed at length with the patient today.  The patient concerns regarding his medicines were addressed. ? ?The following changes have been made:  No change ? ?Labs/ tests ordered today include:  ?No orders of the defined types were placed in this encounter. ? ? ?Recommend 150 minutes/week of aerobic exercise ?Low fat, low carb, high fiber diet recommended ? ?Disposition:   FU in for stress test; 1 year ? ? ?Signed, ?Larae Grooms, MD  ?12/05/2021 3:18 PM    ?[facility] [address], [place], [place]  [address] ?Phone: [phone]; Fax: [phone]  ?  ?

## 2021-12-18 ENCOUNTER — Telehealth (HOSPITAL_COMMUNITY): Payer: Self-pay

## 2021-12-18 NOTE — Telephone Encounter (Signed)
Spoke with the patient, detailed instructions given. He stated that he would be here for his test. Asked to call back with any questions. [name] [name] EMTP 

## 2021-12-23 ENCOUNTER — Telehealth: Payer: Self-pay

## 2021-12-23 ENCOUNTER — Ambulatory Visit (HOSPITAL_COMMUNITY): Payer: BC Managed Care – PPO | Attending: Interventional Cardiology

## 2021-12-23 DIAGNOSIS — I2584 Coronary atherosclerosis due to calcified coronary lesion: Secondary | ICD-10-CM | POA: Insufficient documentation

## 2021-12-23 DIAGNOSIS — I2510 Atherosclerotic heart disease of native coronary artery without angina pectoris: Secondary | ICD-10-CM | POA: Insufficient documentation

## 2021-12-23 LAB — MYOCARDIAL PERFUSION IMAGING
Angina Index: 0
Duke Treadmill Score: 11
Estimated workload: 13.4
Exercise duration (min): 11 min
Exercise duration (sec): 12 s
LV dias vol: 88 mL (ref 62–150)
LV sys vol: 39 mL
MPHR: 169 {beats}/min
Nuc Stress EF: 56 %
Peak HR: 169 {beats}/min
Percent HR: 100 %
Rest HR: 81 {beats}/min
Rest Nuclear Isotope Dose: 10.1 mCi
SDS: 0
SRS: 0
SSS: 0
ST Depression (mm): 0 mm
Stress Nuclear Isotope Dose: 30.6 mCi
TID: 0.98

## 2021-12-23 MED ORDER — TECHNETIUM TC 99M TETROFOSMIN IV KIT
30.6000 | PACK | Freq: Once | INTRAVENOUS | Status: AC | PRN
  Filled 2021-12-23: qty 31

## 2021-12-23 MED ORDER — TECHNETIUM TC 99M TETROFOSMIN IV KIT
10.1000 | PACK | Freq: Once | INTRAVENOUS | Status: AC | PRN
  Administered 2021-12-23: 10.1 via INTRAVENOUS
  Filled 2021-12-23: qty 11

## 2021-12-23 NOTE — Telephone Encounter (Signed)
I can't give an exact percentage based on the stress test, but we know that there is no area of plaque that is significantly blocking blood flow.  That was the goal of the stress test, to make sure that there were no severe blockages and no areas where blood flow was restricted.     ? ? ?Pt advised and verbalized understanding and will put in a recall for one year.  ?

## 2021-12-23 NOTE — Telephone Encounter (Signed)
-----   Message from Jettie Booze, MD sent at 12/23/2021  3:42 PM EDT ----- ?Normal stress test.  Continue with preventive therapy. 1 year f/u with me.  ?

## 2021-12-23 NOTE — Telephone Encounter (Signed)
Pt advised his stress test result but asking if Dr. Irish Lack can tell by looking at his [place] films... what "level of plaquing" he actually does have.  ? ?I advised him that I am not sure if we can answer that in detail but I will forward to Dr. Irish Lack for his review.  ?

## 2022-03-03 DIAGNOSIS — H0288A Meibomian gland dysfunction right eye, upper and lower eyelids: Secondary | ICD-10-CM | POA: Diagnosis not present

## 2022-03-03 DIAGNOSIS — H16223 Keratoconjunctivitis sicca, not specified as Sjogren's, bilateral: Secondary | ICD-10-CM | POA: Diagnosis not present

## 2022-03-03 DIAGNOSIS — H0288B Meibomian gland dysfunction left eye, upper and lower eyelids: Secondary | ICD-10-CM | POA: Diagnosis not present

## 2022-03-19 DIAGNOSIS — D225 Melanocytic nevi of trunk: Secondary | ICD-10-CM | POA: Diagnosis not present

## 2022-03-19 DIAGNOSIS — D2361 Other benign neoplasm of skin of right upper limb, including shoulder: Secondary | ICD-10-CM | POA: Diagnosis not present

## 2022-03-19 DIAGNOSIS — D224 Melanocytic nevi of scalp and neck: Secondary | ICD-10-CM | POA: Diagnosis not present

## 2022-03-19 DIAGNOSIS — L738 Other specified follicular disorders: Secondary | ICD-10-CM | POA: Diagnosis not present

## 2022-04-21 DIAGNOSIS — H16223 Keratoconjunctivitis sicca, not specified as Sjogren's, bilateral: Secondary | ICD-10-CM | POA: Diagnosis not present

## 2022-04-21 DIAGNOSIS — H0288B Meibomian gland dysfunction left eye, upper and lower eyelids: Secondary | ICD-10-CM | POA: Diagnosis not present

## 2022-04-21 DIAGNOSIS — H0288A Meibomian gland dysfunction right eye, upper and lower eyelids: Secondary | ICD-10-CM | POA: Diagnosis not present

## 2022-05-07 DIAGNOSIS — L821 Other seborrheic keratosis: Secondary | ICD-10-CM | POA: Diagnosis not present

## 2022-05-07 DIAGNOSIS — D2372 Other benign neoplasm of skin of left lower limb, including hip: Secondary | ICD-10-CM | POA: Diagnosis not present

## 2022-05-07 DIAGNOSIS — D485 Neoplasm of uncertain behavior of skin: Secondary | ICD-10-CM | POA: Diagnosis not present

## 2022-05-19 DIAGNOSIS — M7582 Other shoulder lesions, left shoulder: Secondary | ICD-10-CM | POA: Diagnosis not present

## 2022-05-19 DIAGNOSIS — M7502 Adhesive capsulitis of left shoulder: Secondary | ICD-10-CM | POA: Diagnosis not present

## 2022-05-27 DIAGNOSIS — S46012D Strain of muscle(s) and tendon(s) of the rotator cuff of left shoulder, subsequent encounter: Secondary | ICD-10-CM | POA: Diagnosis not present

## 2022-06-02 DIAGNOSIS — S46012D Strain of muscle(s) and tendon(s) of the rotator cuff of left shoulder, subsequent encounter: Secondary | ICD-10-CM | POA: Diagnosis not present

## 2022-06-09 DIAGNOSIS — S46012D Strain of muscle(s) and tendon(s) of the rotator cuff of left shoulder, subsequent encounter: Secondary | ICD-10-CM | POA: Diagnosis not present

## 2022-07-31 DIAGNOSIS — S46012D Strain of muscle(s) and tendon(s) of the rotator cuff of left shoulder, subsequent encounter: Secondary | ICD-10-CM | POA: Diagnosis not present

## 2022-08-04 DIAGNOSIS — H0288A Meibomian gland dysfunction right eye, upper and lower eyelids: Secondary | ICD-10-CM | POA: Diagnosis not present

## 2022-08-04 DIAGNOSIS — H0288B Meibomian gland dysfunction left eye, upper and lower eyelids: Secondary | ICD-10-CM | POA: Diagnosis not present

## 2022-08-04 DIAGNOSIS — H16223 Keratoconjunctivitis sicca, not specified as Sjogren's, bilateral: Secondary | ICD-10-CM | POA: Diagnosis not present

## 2022-08-08 DIAGNOSIS — M25512 Pain in left shoulder: Secondary | ICD-10-CM | POA: Diagnosis not present

## 2022-08-11 DIAGNOSIS — M25512 Pain in left shoulder: Secondary | ICD-10-CM | POA: Diagnosis not present

## 2022-10-02 DIAGNOSIS — M25512 Pain in left shoulder: Secondary | ICD-10-CM | POA: Diagnosis not present

## 2022-10-06 DIAGNOSIS — E785 Hyperlipidemia, unspecified: Secondary | ICD-10-CM | POA: Diagnosis not present

## 2022-10-06 DIAGNOSIS — Z125 Encounter for screening for malignant neoplasm of prostate: Secondary | ICD-10-CM | POA: Diagnosis not present

## 2022-10-06 DIAGNOSIS — R7301 Impaired fasting glucose: Secondary | ICD-10-CM | POA: Diagnosis not present

## 2022-10-09 DIAGNOSIS — L821 Other seborrheic keratosis: Secondary | ICD-10-CM | POA: Diagnosis not present

## 2022-10-09 DIAGNOSIS — L570 Actinic keratosis: Secondary | ICD-10-CM | POA: Diagnosis not present

## 2022-10-13 DIAGNOSIS — Z1331 Encounter for screening for depression: Secondary | ICD-10-CM | POA: Diagnosis not present

## 2022-10-13 DIAGNOSIS — Z1339 Encounter for screening examination for other mental health and behavioral disorders: Secondary | ICD-10-CM | POA: Diagnosis not present

## 2022-10-13 DIAGNOSIS — Z0000 Encounter for general adult medical examination without abnormal findings: Secondary | ICD-10-CM | POA: Diagnosis not present

## 2022-10-13 DIAGNOSIS — I2510 Atherosclerotic heart disease of native coronary artery without angina pectoris: Secondary | ICD-10-CM | POA: Diagnosis not present

## 2022-10-13 DIAGNOSIS — R82998 Other abnormal findings in urine: Secondary | ICD-10-CM | POA: Diagnosis not present

## 2022-10-13 DIAGNOSIS — Z1212 Encounter for screening for malignant neoplasm of rectum: Secondary | ICD-10-CM | POA: Diagnosis not present

## 2022-10-26 DIAGNOSIS — X58XXXA Exposure to other specified factors, initial encounter: Secondary | ICD-10-CM | POA: Diagnosis not present

## 2022-10-26 DIAGNOSIS — M75112 Incomplete rotator cuff tear or rupture of left shoulder, not specified as traumatic: Secondary | ICD-10-CM | POA: Diagnosis not present

## 2022-10-26 DIAGNOSIS — S46012A Strain of muscle(s) and tendon(s) of the rotator cuff of left shoulder, initial encounter: Secondary | ICD-10-CM | POA: Diagnosis not present

## 2022-10-26 DIAGNOSIS — Y999 Unspecified external cause status: Secondary | ICD-10-CM | POA: Diagnosis not present

## 2022-10-26 DIAGNOSIS — M24112 Other articular cartilage disorders, left shoulder: Secondary | ICD-10-CM | POA: Diagnosis not present

## 2022-10-26 DIAGNOSIS — M19012 Primary osteoarthritis, left shoulder: Secondary | ICD-10-CM | POA: Diagnosis not present

## 2022-10-26 DIAGNOSIS — M948X1 Other specified disorders of cartilage, shoulder: Secondary | ICD-10-CM | POA: Diagnosis not present

## 2022-10-26 DIAGNOSIS — M7522 Bicipital tendinitis, left shoulder: Secondary | ICD-10-CM | POA: Diagnosis not present

## 2022-10-26 DIAGNOSIS — S46212A Strain of muscle, fascia and tendon of other parts of biceps, left arm, initial encounter: Secondary | ICD-10-CM | POA: Diagnosis not present

## 2022-10-26 DIAGNOSIS — G8918 Other acute postprocedural pain: Secondary | ICD-10-CM | POA: Diagnosis not present

## 2022-10-26 DIAGNOSIS — M7542 Impingement syndrome of left shoulder: Secondary | ICD-10-CM | POA: Diagnosis not present

## 2022-10-26 DIAGNOSIS — S46812A Strain of other muscles, fascia and tendons at shoulder and upper arm level, left arm, initial encounter: Secondary | ICD-10-CM | POA: Diagnosis not present

## 2022-10-26 DIAGNOSIS — S43432A Superior glenoid labrum lesion of left shoulder, initial encounter: Secondary | ICD-10-CM | POA: Diagnosis not present

## 2022-10-30 DIAGNOSIS — S46012D Strain of muscle(s) and tendon(s) of the rotator cuff of left shoulder, subsequent encounter: Secondary | ICD-10-CM | POA: Diagnosis not present

## 2022-11-04 DIAGNOSIS — S46012D Strain of muscle(s) and tendon(s) of the rotator cuff of left shoulder, subsequent encounter: Secondary | ICD-10-CM | POA: Diagnosis not present

## 2022-11-09 DIAGNOSIS — S46012D Strain of muscle(s) and tendon(s) of the rotator cuff of left shoulder, subsequent encounter: Secondary | ICD-10-CM | POA: Diagnosis not present

## 2022-11-12 DIAGNOSIS — H16223 Keratoconjunctivitis sicca, not specified as Sjogren's, bilateral: Secondary | ICD-10-CM | POA: Diagnosis not present

## 2022-11-12 DIAGNOSIS — S46012D Strain of muscle(s) and tendon(s) of the rotator cuff of left shoulder, subsequent encounter: Secondary | ICD-10-CM | POA: Diagnosis not present

## 2022-11-16 DIAGNOSIS — S46012D Strain of muscle(s) and tendon(s) of the rotator cuff of left shoulder, subsequent encounter: Secondary | ICD-10-CM | POA: Diagnosis not present

## 2022-11-19 DIAGNOSIS — S46012D Strain of muscle(s) and tendon(s) of the rotator cuff of left shoulder, subsequent encounter: Secondary | ICD-10-CM | POA: Diagnosis not present

## 2022-11-25 DIAGNOSIS — S46012D Strain of muscle(s) and tendon(s) of the rotator cuff of left shoulder, subsequent encounter: Secondary | ICD-10-CM | POA: Diagnosis not present

## 2022-11-30 DIAGNOSIS — S46012D Strain of muscle(s) and tendon(s) of the rotator cuff of left shoulder, subsequent encounter: Secondary | ICD-10-CM | POA: Diagnosis not present

## 2022-12-08 DIAGNOSIS — S46012D Strain of muscle(s) and tendon(s) of the rotator cuff of left shoulder, subsequent encounter: Secondary | ICD-10-CM | POA: Diagnosis not present

## 2022-12-11 DIAGNOSIS — S46012D Strain of muscle(s) and tendon(s) of the rotator cuff of left shoulder, subsequent encounter: Secondary | ICD-10-CM | POA: Diagnosis not present

## 2022-12-14 DIAGNOSIS — S46012D Strain of muscle(s) and tendon(s) of the rotator cuff of left shoulder, subsequent encounter: Secondary | ICD-10-CM | POA: Diagnosis not present

## 2022-12-23 DIAGNOSIS — S46012D Strain of muscle(s) and tendon(s) of the rotator cuff of left shoulder, subsequent encounter: Secondary | ICD-10-CM | POA: Diagnosis not present

## 2022-12-30 DIAGNOSIS — S46012D Strain of muscle(s) and tendon(s) of the rotator cuff of left shoulder, subsequent encounter: Secondary | ICD-10-CM | POA: Diagnosis not present

## 2023-01-06 DIAGNOSIS — S46012D Strain of muscle(s) and tendon(s) of the rotator cuff of left shoulder, subsequent encounter: Secondary | ICD-10-CM | POA: Diagnosis not present

## 2023-01-13 DIAGNOSIS — S46012D Strain of muscle(s) and tendon(s) of the rotator cuff of left shoulder, subsequent encounter: Secondary | ICD-10-CM | POA: Diagnosis not present

## 2023-01-20 DIAGNOSIS — S46012D Strain of muscle(s) and tendon(s) of the rotator cuff of left shoulder, subsequent encounter: Secondary | ICD-10-CM | POA: Diagnosis not present

## 2023-01-27 DIAGNOSIS — S46012D Strain of muscle(s) and tendon(s) of the rotator cuff of left shoulder, subsequent encounter: Secondary | ICD-10-CM | POA: Diagnosis not present

## 2023-02-03 DIAGNOSIS — S46012D Strain of muscle(s) and tendon(s) of the rotator cuff of left shoulder, subsequent encounter: Secondary | ICD-10-CM | POA: Diagnosis not present

## 2023-02-10 DIAGNOSIS — S46012D Strain of muscle(s) and tendon(s) of the rotator cuff of left shoulder, subsequent encounter: Secondary | ICD-10-CM | POA: Diagnosis not present

## 2023-02-24 DIAGNOSIS — S46012D Strain of muscle(s) and tendon(s) of the rotator cuff of left shoulder, subsequent encounter: Secondary | ICD-10-CM | POA: Diagnosis not present

## 2023-03-10 DIAGNOSIS — S46012D Strain of muscle(s) and tendon(s) of the rotator cuff of left shoulder, subsequent encounter: Secondary | ICD-10-CM | POA: Diagnosis not present

## 2023-04-20 DIAGNOSIS — M5387 Other specified dorsopathies, lumbosacral region: Secondary | ICD-10-CM | POA: Diagnosis not present

## 2023-04-20 DIAGNOSIS — M9903 Segmental and somatic dysfunction of lumbar region: Secondary | ICD-10-CM | POA: Diagnosis not present

## 2023-04-20 DIAGNOSIS — M9905 Segmental and somatic dysfunction of pelvic region: Secondary | ICD-10-CM | POA: Diagnosis not present

## 2023-04-20 DIAGNOSIS — M5441 Lumbago with sciatica, right side: Secondary | ICD-10-CM | POA: Diagnosis not present

## 2023-04-28 DIAGNOSIS — M9901 Segmental and somatic dysfunction of cervical region: Secondary | ICD-10-CM | POA: Diagnosis not present

## 2023-04-28 DIAGNOSIS — M9903 Segmental and somatic dysfunction of lumbar region: Secondary | ICD-10-CM | POA: Diagnosis not present

## 2023-04-28 DIAGNOSIS — M9902 Segmental and somatic dysfunction of thoracic region: Secondary | ICD-10-CM | POA: Diagnosis not present

## 2023-04-28 DIAGNOSIS — M9906 Segmental and somatic dysfunction of lower extremity: Secondary | ICD-10-CM | POA: Diagnosis not present

## 2023-04-28 DIAGNOSIS — M5441 Lumbago with sciatica, right side: Secondary | ICD-10-CM | POA: Diagnosis not present

## 2023-04-28 DIAGNOSIS — M7612 Psoas tendinitis, left hip: Secondary | ICD-10-CM | POA: Diagnosis not present

## 2023-04-28 DIAGNOSIS — M5387 Other specified dorsopathies, lumbosacral region: Secondary | ICD-10-CM | POA: Diagnosis not present

## 2023-04-28 DIAGNOSIS — M9905 Segmental and somatic dysfunction of pelvic region: Secondary | ICD-10-CM | POA: Diagnosis not present

## 2023-04-28 DIAGNOSIS — M5451 Vertebrogenic low back pain: Secondary | ICD-10-CM | POA: Diagnosis not present

## 2023-06-21 DIAGNOSIS — M25572 Pain in left ankle and joints of left foot: Secondary | ICD-10-CM | POA: Diagnosis not present

## 2023-07-06 DIAGNOSIS — M9903 Segmental and somatic dysfunction of lumbar region: Secondary | ICD-10-CM | POA: Diagnosis not present

## 2023-07-06 DIAGNOSIS — M9906 Segmental and somatic dysfunction of lower extremity: Secondary | ICD-10-CM | POA: Diagnosis not present

## 2023-07-06 DIAGNOSIS — M9905 Segmental and somatic dysfunction of pelvic region: Secondary | ICD-10-CM | POA: Diagnosis not present

## 2023-07-06 DIAGNOSIS — M9902 Segmental and somatic dysfunction of thoracic region: Secondary | ICD-10-CM | POA: Diagnosis not present

## 2023-07-06 DIAGNOSIS — M9901 Segmental and somatic dysfunction of cervical region: Secondary | ICD-10-CM | POA: Diagnosis not present

## 2023-07-09 DIAGNOSIS — M9901 Segmental and somatic dysfunction of cervical region: Secondary | ICD-10-CM | POA: Diagnosis not present

## 2023-07-09 DIAGNOSIS — M9906 Segmental and somatic dysfunction of lower extremity: Secondary | ICD-10-CM | POA: Diagnosis not present

## 2023-07-09 DIAGNOSIS — M9903 Segmental and somatic dysfunction of lumbar region: Secondary | ICD-10-CM | POA: Diagnosis not present

## 2023-07-09 DIAGNOSIS — M5441 Lumbago with sciatica, right side: Secondary | ICD-10-CM | POA: Diagnosis not present

## 2023-07-09 DIAGNOSIS — M9905 Segmental and somatic dysfunction of pelvic region: Secondary | ICD-10-CM | POA: Diagnosis not present

## 2023-09-08 DIAGNOSIS — M9901 Segmental and somatic dysfunction of cervical region: Secondary | ICD-10-CM | POA: Diagnosis not present

## 2023-09-08 DIAGNOSIS — M9903 Segmental and somatic dysfunction of lumbar region: Secondary | ICD-10-CM | POA: Diagnosis not present

## 2023-09-08 DIAGNOSIS — M9905 Segmental and somatic dysfunction of pelvic region: Secondary | ICD-10-CM | POA: Diagnosis not present

## 2023-09-08 DIAGNOSIS — M9906 Segmental and somatic dysfunction of lower extremity: Secondary | ICD-10-CM | POA: Diagnosis not present

## 2023-09-08 DIAGNOSIS — M9902 Segmental and somatic dysfunction of thoracic region: Secondary | ICD-10-CM | POA: Diagnosis not present

## 2023-10-01 DIAGNOSIS — M25511 Pain in right shoulder: Secondary | ICD-10-CM | POA: Diagnosis not present

## 2023-10-14 DIAGNOSIS — E785 Hyperlipidemia, unspecified: Secondary | ICD-10-CM | POA: Diagnosis not present

## 2023-10-14 DIAGNOSIS — D2239 Melanocytic nevi of other parts of face: Secondary | ICD-10-CM | POA: Diagnosis not present

## 2023-10-14 DIAGNOSIS — D2361 Other benign neoplasm of skin of right upper limb, including shoulder: Secondary | ICD-10-CM | POA: Diagnosis not present

## 2023-10-14 DIAGNOSIS — Z125 Encounter for screening for malignant neoplasm of prostate: Secondary | ICD-10-CM | POA: Diagnosis not present

## 2023-10-14 DIAGNOSIS — L853 Xerosis cutis: Secondary | ICD-10-CM | POA: Diagnosis not present

## 2023-10-14 DIAGNOSIS — L814 Other melanin hyperpigmentation: Secondary | ICD-10-CM | POA: Diagnosis not present

## 2023-10-19 DIAGNOSIS — H16223 Keratoconjunctivitis sicca, not specified as Sjogren's, bilateral: Secondary | ICD-10-CM | POA: Diagnosis not present

## 2023-10-19 DIAGNOSIS — H0288B Meibomian gland dysfunction left eye, upper and lower eyelids: Secondary | ICD-10-CM | POA: Diagnosis not present

## 2023-10-19 DIAGNOSIS — H0288A Meibomian gland dysfunction right eye, upper and lower eyelids: Secondary | ICD-10-CM | POA: Diagnosis not present

## 2023-10-21 DIAGNOSIS — R82998 Other abnormal findings in urine: Secondary | ICD-10-CM | POA: Diagnosis not present

## 2023-10-21 DIAGNOSIS — Z1331 Encounter for screening for depression: Secondary | ICD-10-CM | POA: Diagnosis not present

## 2023-10-21 DIAGNOSIS — Z0000 Encounter for general adult medical examination without abnormal findings: Secondary | ICD-10-CM | POA: Diagnosis not present

## 2023-10-21 DIAGNOSIS — M180 Bilateral primary osteoarthritis of first carpometacarpal joints: Secondary | ICD-10-CM | POA: Diagnosis not present

## 2023-10-21 DIAGNOSIS — Z1339 Encounter for screening examination for other mental health and behavioral disorders: Secondary | ICD-10-CM | POA: Diagnosis not present

## 2023-10-21 DIAGNOSIS — Z1212 Encounter for screening for malignant neoplasm of rectum: Secondary | ICD-10-CM | POA: Diagnosis not present

## 2023-11-15 DIAGNOSIS — H16223 Keratoconjunctivitis sicca, not specified as Sjogren's, bilateral: Secondary | ICD-10-CM | POA: Diagnosis not present

## 2023-11-15 DIAGNOSIS — H0288A Meibomian gland dysfunction right eye, upper and lower eyelids: Secondary | ICD-10-CM | POA: Diagnosis not present

## 2023-11-15 DIAGNOSIS — H0288B Meibomian gland dysfunction left eye, upper and lower eyelids: Secondary | ICD-10-CM | POA: Diagnosis not present

## 2023-11-26 DIAGNOSIS — M25511 Pain in right shoulder: Secondary | ICD-10-CM | POA: Diagnosis not present

## 2023-12-01 IMAGING — CT CT CARDIAC CORONARY ARTERY CALCIUM SCORE
3 series · 14 of 20 positions shown, 16 images · non-contrast
Comparison: None.

CLINICAL DATA: 50-year-old Caucasian male with hyperlipidemia.

EXAM:
CT CARDIAC CORONARY ARTERY CALCIUM SCORE
TECHNIQUE: Non-contrast imaging through the heart was performed using
prospective ECG gating. Image post processing was performed on an
independent workstation, allowing for quantitative analysis of the
heart and coronary arteries. Note that this exam targets the heart
and the chest was not imaged in its entirety.

[Series 2: calcium scoring 2.00 qr36 bestdiast 69% hrt calciu · axial · 0.38mm/px · z∈[+1592,+1688]mm · 4 of 80 slices shown]
[im 16/80  vessel]
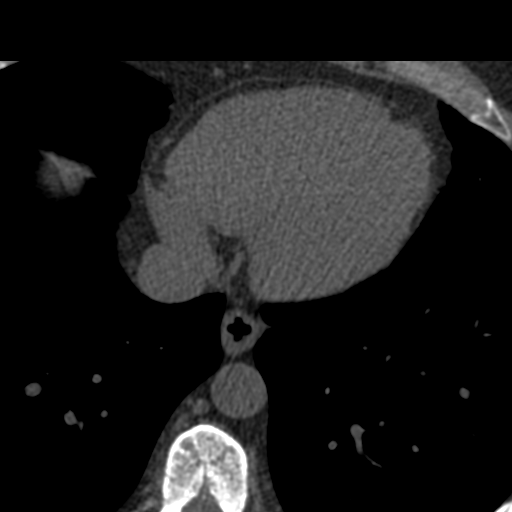
[im 32/80  vessel]
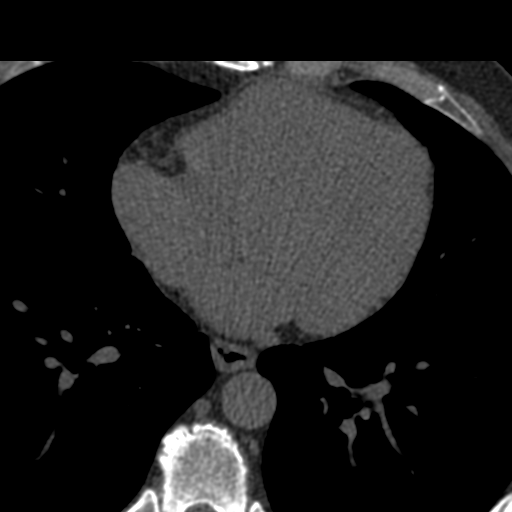
[im 48/80  vessel]
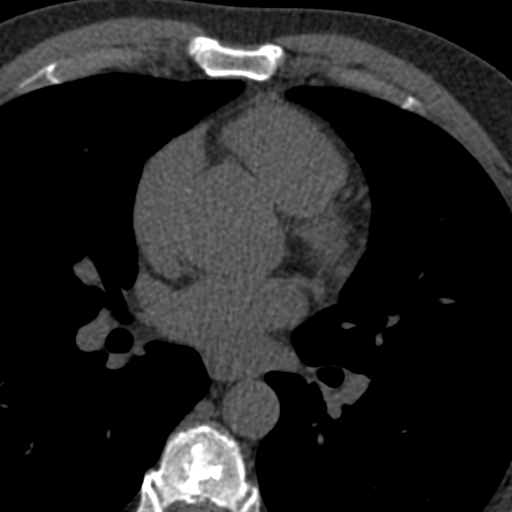
[im 64/80  vessel]
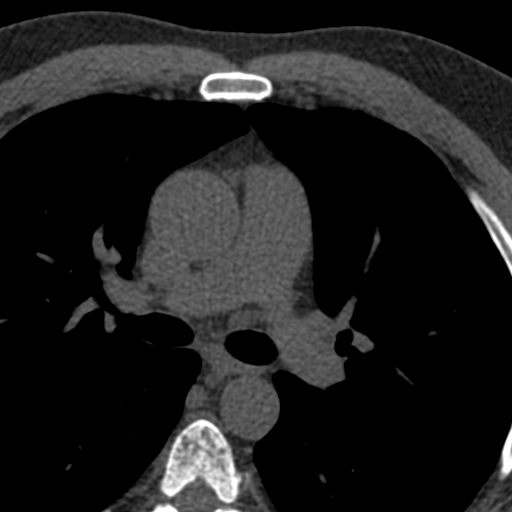

[Series 3: calcium scoring 2.00 br40 bestdiast 69% axial · axial · 0.55mm/px · z∈[+1588,+1692]mm · 5 of 80 slices shown, 7 images]
[im 14/80  vessel]
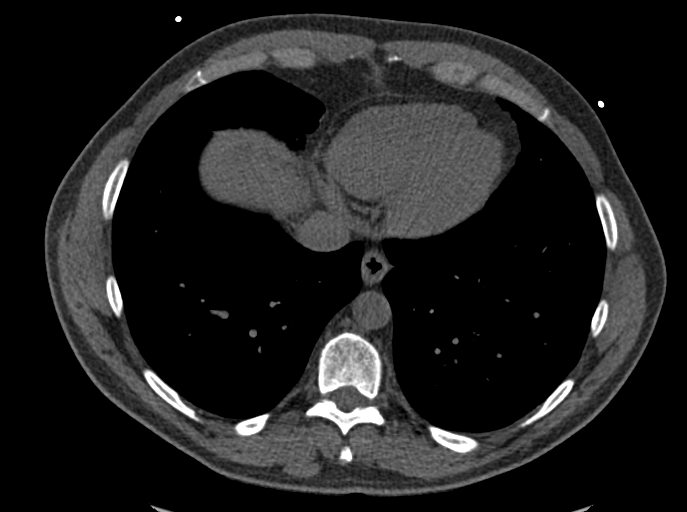
[im 14/80  lung]
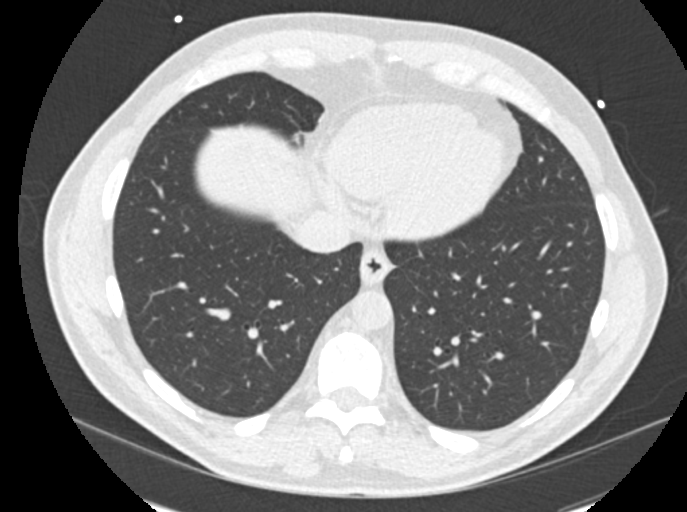
[im 27/80  vessel]
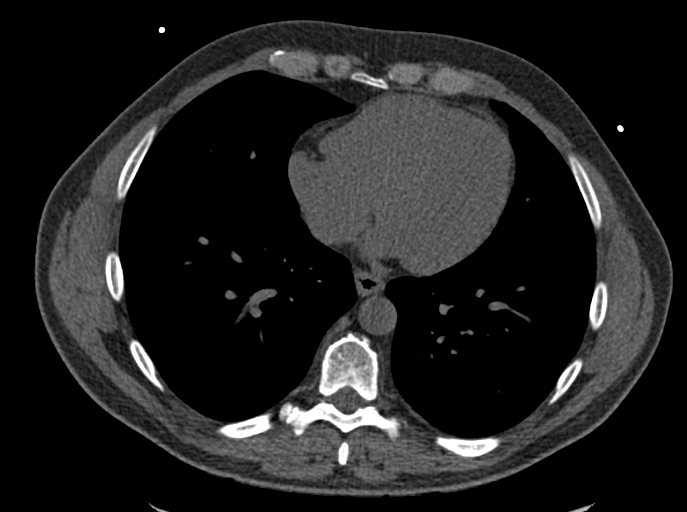
[im 40/80  vessel]
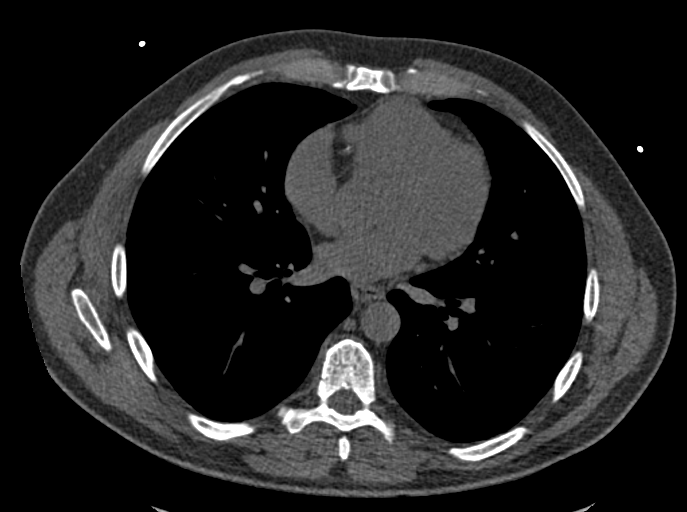
[im 53/80  vessel]
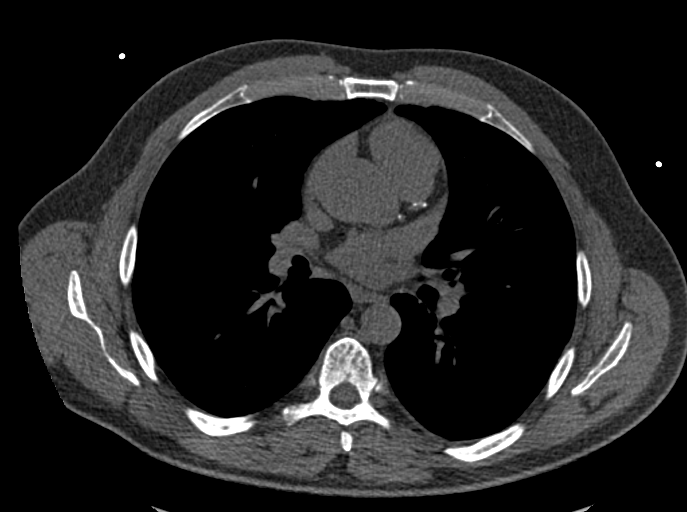
[im 66/80  vessel]
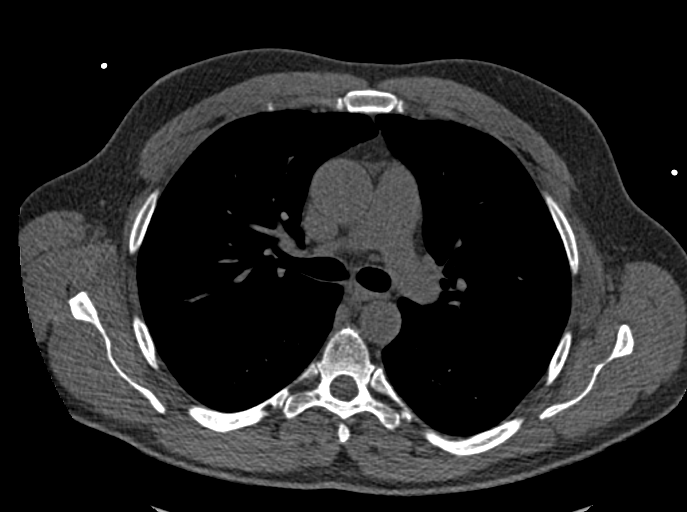
[im 66/80  lung]
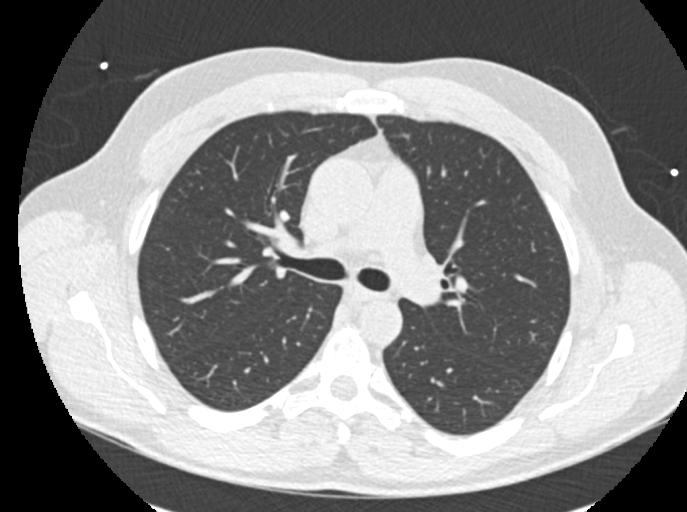

[Series 9: calcium scoring 2.00 br60 bestdiast 69% lungs · axial · 0.55mm/px · z∈[+1588,+1692]mm · 5 of 80 slices shown]
[im 14/80  vessel]
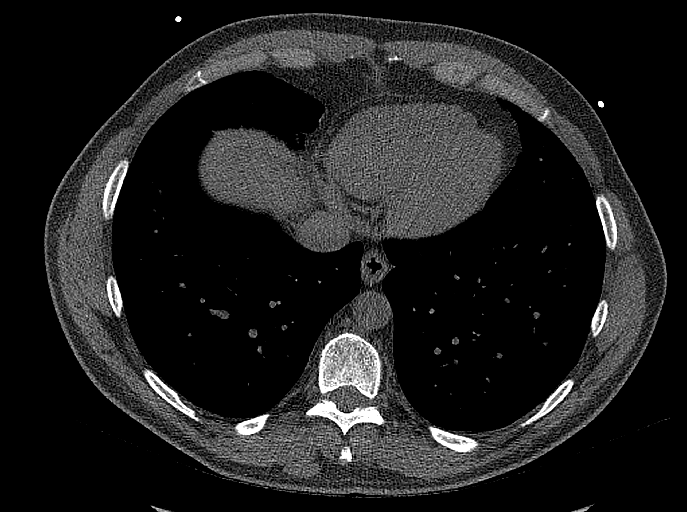
[im 27/80  vessel]
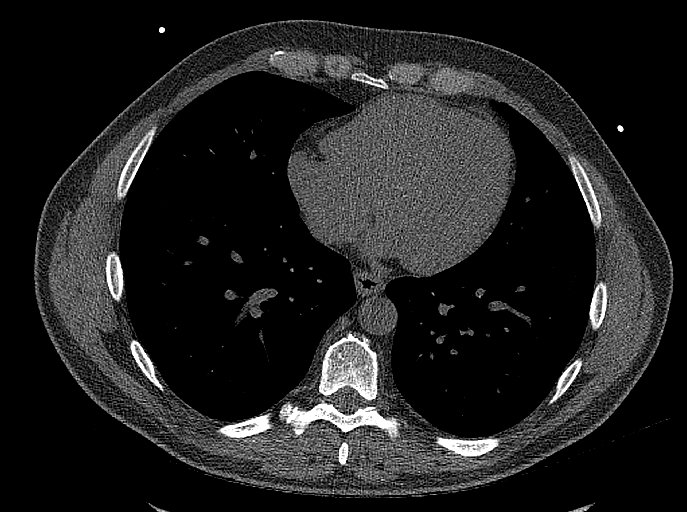
[im 40/80  vessel]
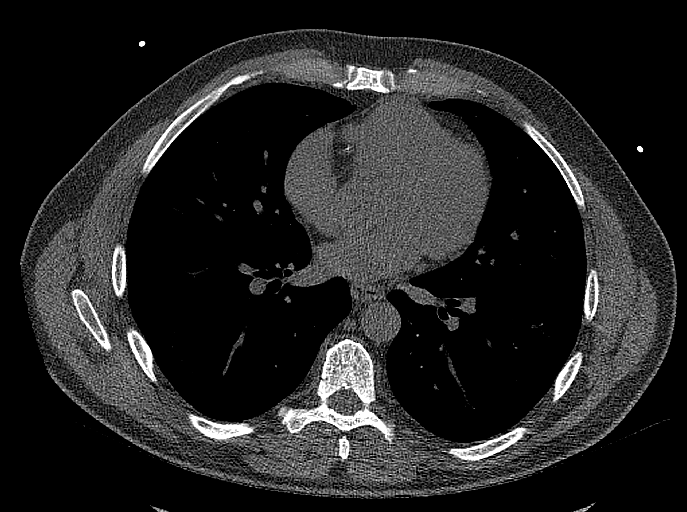
[im 53/80  vessel]
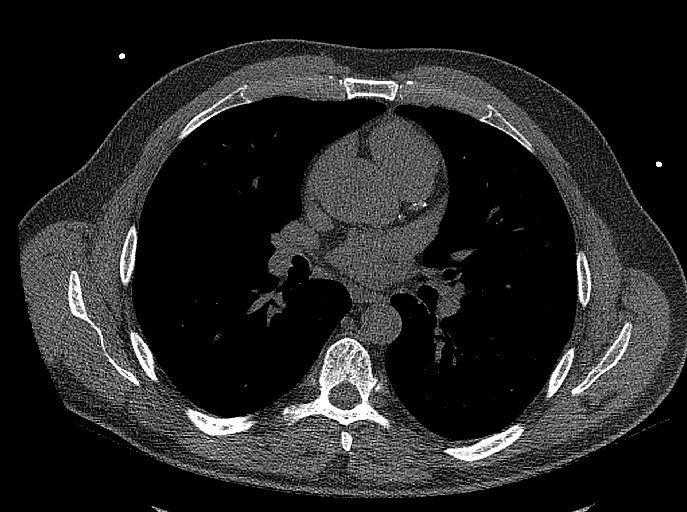
[im 66/80  vessel]
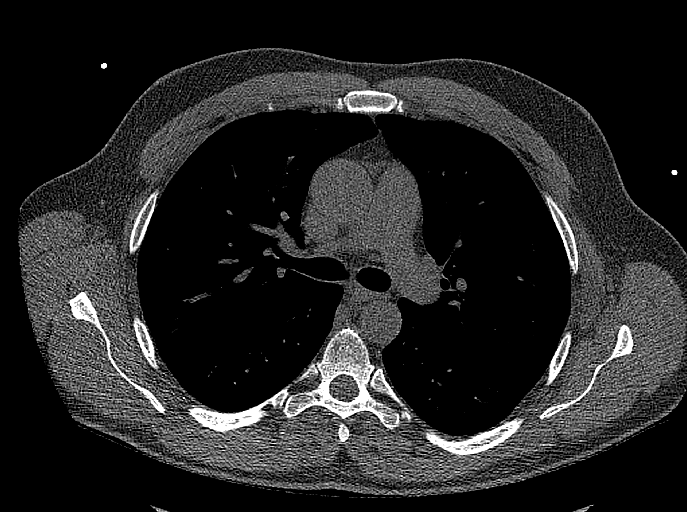

[14 of 20 positions shown; findings below may reference images not displayed]

FINDINGS: Technical quality: Good

CORONARY CALCIUM SCORES:

Left Main: 13

LAD: 37

LCx: 13

RCA: 66

CORONARY CALCIUM

Total Agatston Score: 129

[HOSPITAL] percentile: 88

Ascending aorta (normal <  40 mm): 34 mm

EXTRACARDIAC FINDINGS:

Limited view of the lung parenchyma demonstrates no suspicious
nodularity. Airways are normal.

Limited view of the mediastinum demonstrates no adenopathy.
Esophagus normal.

Limited view of the upper abdomen is unremarkable.

Limited view of the skeleton and chest wall is unremarkable.
IMPRESSION: 1. THREE-VESSEL coronary artery calcification.

2. Total Agatston Score: 129

3. MESA age and sex matched database percentile: 88th

## 2023-12-06 DIAGNOSIS — M25511 Pain in right shoulder: Secondary | ICD-10-CM | POA: Diagnosis not present

## 2023-12-06 DIAGNOSIS — M7521 Bicipital tendinitis, right shoulder: Secondary | ICD-10-CM | POA: Diagnosis not present

## 2024-01-18 DIAGNOSIS — M25511 Pain in right shoulder: Secondary | ICD-10-CM | POA: Diagnosis not present

## 2024-01-29 DIAGNOSIS — M25512 Pain in left shoulder: Secondary | ICD-10-CM | POA: Diagnosis not present

## 2024-01-29 DIAGNOSIS — M546 Pain in thoracic spine: Secondary | ICD-10-CM | POA: Diagnosis not present

## 2024-01-29 DIAGNOSIS — M542 Cervicalgia: Secondary | ICD-10-CM | POA: Diagnosis not present

## 2024-02-03 DIAGNOSIS — M25512 Pain in left shoulder: Secondary | ICD-10-CM | POA: Diagnosis not present

## 2024-02-21 DIAGNOSIS — M7521 Bicipital tendinitis, right shoulder: Secondary | ICD-10-CM | POA: Diagnosis not present

## 2024-04-03 DIAGNOSIS — M9903 Segmental and somatic dysfunction of lumbar region: Secondary | ICD-10-CM | POA: Diagnosis not present

## 2024-04-03 DIAGNOSIS — M9901 Segmental and somatic dysfunction of cervical region: Secondary | ICD-10-CM | POA: Diagnosis not present

## 2024-04-03 DIAGNOSIS — M9905 Segmental and somatic dysfunction of pelvic region: Secondary | ICD-10-CM | POA: Diagnosis not present

## 2024-04-03 DIAGNOSIS — M9902 Segmental and somatic dysfunction of thoracic region: Secondary | ICD-10-CM | POA: Diagnosis not present

## 2024-04-11 DIAGNOSIS — M7521 Bicipital tendinitis, right shoulder: Secondary | ICD-10-CM | POA: Diagnosis not present

## 2024-05-26 DIAGNOSIS — M7531 Calcific tendinitis of right shoulder: Secondary | ICD-10-CM | POA: Diagnosis not present

## 2024-06-15 DIAGNOSIS — M7531 Calcific tendinitis of right shoulder: Secondary | ICD-10-CM | POA: Diagnosis not present

## 2024-06-23 DIAGNOSIS — S46011D Strain of muscle(s) and tendon(s) of the rotator cuff of right shoulder, subsequent encounter: Secondary | ICD-10-CM | POA: Diagnosis not present

## 2024-06-27 DIAGNOSIS — L821 Other seborrheic keratosis: Secondary | ICD-10-CM | POA: Diagnosis not present

## 2024-06-27 DIAGNOSIS — D692 Other nonthrombocytopenic purpura: Secondary | ICD-10-CM | POA: Diagnosis not present

## 2024-06-29 DIAGNOSIS — M9901 Segmental and somatic dysfunction of cervical region: Secondary | ICD-10-CM | POA: Diagnosis not present

## 2024-06-29 DIAGNOSIS — M9905 Segmental and somatic dysfunction of pelvic region: Secondary | ICD-10-CM | POA: Diagnosis not present

## 2024-06-29 DIAGNOSIS — M9902 Segmental and somatic dysfunction of thoracic region: Secondary | ICD-10-CM | POA: Diagnosis not present

## 2024-06-29 DIAGNOSIS — M9903 Segmental and somatic dysfunction of lumbar region: Secondary | ICD-10-CM | POA: Diagnosis not present

## 2024-07-06 DIAGNOSIS — M9901 Segmental and somatic dysfunction of cervical region: Secondary | ICD-10-CM | POA: Diagnosis not present

## 2024-07-06 DIAGNOSIS — M9902 Segmental and somatic dysfunction of thoracic region: Secondary | ICD-10-CM | POA: Diagnosis not present

## 2024-07-06 DIAGNOSIS — S46011D Strain of muscle(s) and tendon(s) of the rotator cuff of right shoulder, subsequent encounter: Secondary | ICD-10-CM | POA: Diagnosis not present

## 2024-07-06 DIAGNOSIS — M9903 Segmental and somatic dysfunction of lumbar region: Secondary | ICD-10-CM | POA: Diagnosis not present

## 2024-07-06 DIAGNOSIS — M9905 Segmental and somatic dysfunction of pelvic region: Secondary | ICD-10-CM | POA: Diagnosis not present

## 2024-07-13 DIAGNOSIS — S46011D Strain of muscle(s) and tendon(s) of the rotator cuff of right shoulder, subsequent encounter: Secondary | ICD-10-CM | POA: Diagnosis not present

## 2024-07-18 DIAGNOSIS — M9903 Segmental and somatic dysfunction of lumbar region: Secondary | ICD-10-CM | POA: Diagnosis not present

## 2024-07-18 DIAGNOSIS — M9905 Segmental and somatic dysfunction of pelvic region: Secondary | ICD-10-CM | POA: Diagnosis not present

## 2024-07-18 DIAGNOSIS — M9901 Segmental and somatic dysfunction of cervical region: Secondary | ICD-10-CM | POA: Diagnosis not present

## 2024-07-18 DIAGNOSIS — M9902 Segmental and somatic dysfunction of thoracic region: Secondary | ICD-10-CM | POA: Diagnosis not present

## 2024-07-20 DIAGNOSIS — S46011D Strain of muscle(s) and tendon(s) of the rotator cuff of right shoulder, subsequent encounter: Secondary | ICD-10-CM | POA: Diagnosis not present

## 2024-07-28 DIAGNOSIS — M67911 Unspecified disorder of synovium and tendon, right shoulder: Secondary | ICD-10-CM | POA: Diagnosis not present

## 2024-07-31 DIAGNOSIS — M9903 Segmental and somatic dysfunction of lumbar region: Secondary | ICD-10-CM | POA: Diagnosis not present

## 2024-07-31 DIAGNOSIS — M9902 Segmental and somatic dysfunction of thoracic region: Secondary | ICD-10-CM | POA: Diagnosis not present

## 2024-07-31 DIAGNOSIS — M9905 Segmental and somatic dysfunction of pelvic region: Secondary | ICD-10-CM | POA: Diagnosis not present

## 2024-07-31 DIAGNOSIS — M9901 Segmental and somatic dysfunction of cervical region: Secondary | ICD-10-CM | POA: Diagnosis not present
